# Patient Record
Sex: Male | Born: 1985 | Race: Black or African American | Hispanic: No | Marital: Single | Smoking: Current every day smoker
Health system: Southern US, Community
[De-identification: ages and names within clinical notes are randomized; demographics above are authoritative.]

## PROBLEM LIST (undated history)

## (undated) DIAGNOSIS — L739 Follicular disorder, unspecified: Secondary | ICD-10-CM

---

## 2007-09-03 ENCOUNTER — Emergency Department (HOSPITAL_COMMUNITY): Admission: EM | Admit: 2007-09-03 | Discharge: 2007-09-03 | Payer: Self-pay | Admitting: Emergency Medicine

## 2008-11-20 ENCOUNTER — Emergency Department (HOSPITAL_COMMUNITY): Admission: EM | Admit: 2008-11-20 | Discharge: 2008-11-20 | Payer: Self-pay | Admitting: Emergency Medicine

## 2009-12-06 ENCOUNTER — Emergency Department (HOSPITAL_COMMUNITY): Admission: EM | Admit: 2009-12-06 | Discharge: 2009-12-06 | Payer: Self-pay | Admitting: Emergency Medicine

## 2009-12-14 ENCOUNTER — Emergency Department (HOSPITAL_COMMUNITY): Admission: EM | Admit: 2009-12-14 | Discharge: 2009-12-14 | Payer: Self-pay | Admitting: Emergency Medicine

## 2010-10-23 LAB — RAPID STREP SCREEN (MED CTR MEBANE ONLY): Streptococcus, Group A Screen (Direct): NEGATIVE

## 2011-04-08 LAB — GC/CHLAMYDIA PROBE AMP, GENITAL: Chlamydia, DNA Probe: NEGATIVE

## 2011-07-25 ENCOUNTER — Emergency Department (HOSPITAL_COMMUNITY): Payer: No Typology Code available for payment source

## 2011-07-25 ENCOUNTER — Emergency Department (HOSPITAL_COMMUNITY)
Admission: EM | Admit: 2011-07-25 | Discharge: 2011-07-26 | Disposition: A | Discharge status: Home / Self Care | Payer: No Typology Code available for payment source | Attending: Emergency Medicine | Admitting: Emergency Medicine

## 2011-07-25 ENCOUNTER — Encounter (HOSPITAL_COMMUNITY): Payer: Self-pay | Admitting: *Deleted

## 2011-07-25 DIAGNOSIS — F17200 Nicotine dependence, unspecified, uncomplicated: Secondary | ICD-10-CM | POA: Insufficient documentation

## 2011-07-25 DIAGNOSIS — R10817 Generalized abdominal tenderness: Secondary | ICD-10-CM | POA: Insufficient documentation

## 2011-07-25 DIAGNOSIS — R51 Headache: Secondary | ICD-10-CM | POA: Insufficient documentation

## 2011-07-25 DIAGNOSIS — T148XXA Other injury of unspecified body region, initial encounter: Secondary | ICD-10-CM

## 2011-07-25 DIAGNOSIS — M542 Cervicalgia: Secondary | ICD-10-CM | POA: Insufficient documentation

## 2011-07-25 DIAGNOSIS — S161XXA Strain of muscle, fascia and tendon at neck level, initial encounter: Secondary | ICD-10-CM

## 2011-07-25 DIAGNOSIS — S139XXA Sprain of joints and ligaments of unspecified parts of neck, initial encounter: Secondary | ICD-10-CM | POA: Insufficient documentation

## 2011-07-25 DIAGNOSIS — M549 Dorsalgia, unspecified: Secondary | ICD-10-CM | POA: Insufficient documentation

## 2011-07-25 DIAGNOSIS — J029 Acute pharyngitis, unspecified: Secondary | ICD-10-CM | POA: Insufficient documentation

## 2011-07-25 MED ORDER — DIAZEPAM 5 MG/ML IJ SOLN: 5.0000 mg | Freq: Once | INTRAMUSCULAR | Status: DC

## 2011-07-25 MED ORDER — IBUPROFEN 800 MG PO TABS
800.0000 mg | ORAL_TABLET | Freq: Once | ORAL | Status: AC
  Administered 2011-07-26: 800 mg via ORAL
  Filled 2011-07-25: qty 1

## 2011-07-25 MED ORDER — HYDROCODONE-ACETAMINOPHEN 5-325 MG PO TABS
1.0000 | ORAL_TABLET | Freq: Once | ORAL | Status: AC
  Administered 2011-07-26: 1 via ORAL
  Filled 2011-07-25: qty 1
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
## 2011-07-26 LAB — RAPID STREP SCREEN (MED CTR MEBANE ONLY): Streptococcus, Group A Screen (Direct): NEGATIVE

## 2011-07-26 MED ORDER — DIAZEPAM 5 MG PO TABS
5.0000 mg | ORAL_TABLET | Freq: Once | ORAL | Status: AC
  Administered 2011-07-26: 5 mg via ORAL
  Filled 2011-07-26: qty 1

## 2011-07-26 MED ORDER — DIAZEPAM 5 MG PO TABS: 5.0000 mg | ORAL_TABLET | Freq: Three times a day (TID) | ORAL | Status: AC | PRN

## 2011-07-26 MED ORDER — IBUPROFEN 400 MG PO TABS: 400.0000 mg | ORAL_TABLET | Freq: Four times a day (QID) | ORAL | Status: AC | PRN

## 2011-07-26 MED ORDER — HYDROCODONE-ACETAMINOPHEN 5-325 MG PO TABS: 1.0000 | ORAL_TABLET | ORAL | Status: AC | PRN

## 2011-07-26 NOTE — ED Provider Notes (Signed)
Medical screening examination/treatment/procedure(s) were performed by non-physician practitioner and as supervising physician I was immediately available for consultation/collaboration.   Benny Lennert, MD 07/26/11 1451
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
## 2011-07-26 NOTE — ED Notes (Signed)
Pt able to ambulate without difficulty.

## 2012-07-12 ENCOUNTER — Encounter (HOSPITAL_COMMUNITY): Payer: Self-pay | Admitting: Family Medicine

## 2012-07-12 ENCOUNTER — Emergency Department (HOSPITAL_COMMUNITY)
Admission: EM | Admit: 2012-07-12 | Discharge: 2012-07-12 | Disposition: A | Discharge status: Home / Self Care | Payer: Managed Care, Other (non HMO) | Attending: Emergency Medicine | Admitting: Emergency Medicine

## 2012-07-12 ENCOUNTER — Emergency Department (HOSPITAL_COMMUNITY): Payer: Managed Care, Other (non HMO)

## 2012-07-12 DIAGNOSIS — Z23 Encounter for immunization: Secondary | ICD-10-CM | POA: Insufficient documentation

## 2012-07-12 DIAGNOSIS — Y92009 Unspecified place in unspecified non-institutional (private) residence as the place of occurrence of the external cause: Secondary | ICD-10-CM | POA: Insufficient documentation

## 2012-07-12 DIAGNOSIS — W261XXA Contact with sword or dagger, initial encounter: Secondary | ICD-10-CM | POA: Insufficient documentation

## 2012-07-12 DIAGNOSIS — S61209A Unspecified open wound of unspecified finger without damage to nail, initial encounter: Secondary | ICD-10-CM | POA: Insufficient documentation

## 2012-07-12 DIAGNOSIS — J45909 Unspecified asthma, uncomplicated: Secondary | ICD-10-CM | POA: Insufficient documentation

## 2012-07-12 DIAGNOSIS — S61219A Laceration without foreign body of unspecified finger without damage to nail, initial encounter: Secondary | ICD-10-CM

## 2012-07-12 DIAGNOSIS — Y93G1 Activity, food preparation and clean up: Secondary | ICD-10-CM | POA: Insufficient documentation

## 2012-07-12 DIAGNOSIS — W260XXA Contact with knife, initial encounter: Secondary | ICD-10-CM | POA: Insufficient documentation

## 2012-07-12 MED ORDER — LIDOCAINE HCL (PF) 1 % IJ SOLN: 5.0000 mL | Freq: Once | INTRAMUSCULAR | Status: DC

## 2012-07-12 MED ORDER — TETANUS-DIPHTH-ACELL PERTUSSIS 5-2.5-18.5 LF-MCG/0.5 IM SUSP
0.5000 mL | Freq: Once | INTRAMUSCULAR | Status: AC
  Administered 2012-07-12: 0.5 mL via INTRAMUSCULAR
  Filled 2012-07-12: qty 0.5

## 2012-07-12 NOTE — ED Provider Notes (Signed)
History     CSN: [id number]  Arrival date & time 07/12/12  1452   First MD Initiated Contact with Patient 07/12/12 1700      Chief Complaint  Patient presents with  . Laceration    (Consider location/radiation/quality/duration/timing/severity/associated sxs/prior treatment) HPI  Patient presents to the emergency department after accidentally cutting his right thumb. While he was  cooking  at home just  and the knife slipped. He came in with his finger wrapped. Bleeding is  controlled at this time he denies being unable to feel or move his finger. He feels like it may have gone  part of the nail. nad vss  Past Medical History  Diagnosis Date  . Asthma     History reviewed. No pertinent past surgical history.  History reviewed. No pertinent family history.  History  Substance Use Topics  . Smoking status: Current Every Day Smoker  . Smokeless tobacco: Not on file  . Alcohol Use: Yes      Review of Systems  Review of Systems  Gen: no weight loss, fevers, chills, night sweats  Eyes: no discharge or drainage, no occular pain or visual changes  Nose: no epistaxis or rhinorrhea  Mouth: no dental pain, no sore throat  Neck: no neck pain  Lungs:No wheezing, coughing or hemoptysis CV: no chest pain, palpitations, dependent edema or orthopnea  Abd: no abdominal pain, nausea, vomiting  GU: no dysuria or gross hematuria  MSK:  No abnormalities  Neuro: no headache, no focal neurologic deficits  Skin: laceration to right thumb Psyche: negative.   Allergies  Review of patient's allergies indicates no known allergies.  Home Medications  No current outpatient prescriptions on file.  BP 118/77  Pulse 84  Temp 98 F (36.7 C)  Resp 18  SpO2 100%  Physical Exam  Nursing note and vitals reviewed. Constitutional: He appears well-developed and well-nourished. No distress.  HENT:  Head: Normocephalic and atraumatic.  Eyes: Pupils are equal, round, and reactive to  light.  Neck: Normal range of motion. Neck supple.  Cardiovascular: Normal rate and regular rhythm.   Pulmonary/Chest: Effort normal.  Abdominal: Soft.  Musculoskeletal:       Right hand: He exhibits tenderness and laceration. He exhibits normal range of motion, no bony tenderness, normal two-point discrimination, normal capillary refill, no deformity and no swelling. normal sensation noted. Decreased sensation is not present in the ulnar distribution, is not present in the medial distribution and is not present in the radial distribution. Normal strength noted. He exhibits no finger abduction, no thumb/finger opposition and no wrist extension trouble.       Hands: Neurological: He is alert.  Skin: Skin is warm and dry.    ED Course  LACERATION REPAIR Date/Time: 07/12/2012 5:39 PM Performed by: Dorthula Matas Authorized by: Dorthula Matas Consent: Verbal consent obtained. Risks and benefits: risks, benefits and alternatives were discussed Consent given by: patient Location: right thumb. Laceration length: 1 cm Foreign bodies: metal Tendon involvement: none Nerve involvement: none Vascular damage: no Patient sedated: no Irrigation solution: saline Approximation: close Approximation difficulty: simple Dressing: 4x4 sterile gauze Patient tolerance: Patient tolerated the procedure well with no immediate complications. Comments: Dermabond used to close finger per pt request   (including critical care time)  Labs Reviewed - No data to display No results found.   1. Finger laceration       MDM  Tetanus UTD. Finger Dermabond Pt has been advised of the symptoms that warrant their return  to the ED. Patient has voiced understanding and has agreed to follow-up with the PCP or specialist.       Dorthula Matas, PA 07/12/12 1820

## 2012-07-12 NOTE — ED Notes (Signed)
Per pt sts he was cooking and cut left thumb. Finger wrapped.

## 2012-07-18 NOTE — ED Provider Notes (Signed)
Medical screening examination/treatment/procedure(s) were performed by non-physician practitioner and as supervising physician I was immediately available for consultation/collaboration.  Rui Wordell R. Cody Oliger, MD 07/18/12 0730 

## 2012-12-06 DIAGNOSIS — R51 Headache: Secondary | ICD-10-CM | POA: Insufficient documentation

## 2012-12-06 DIAGNOSIS — F17200 Nicotine dependence, unspecified, uncomplicated: Secondary | ICD-10-CM | POA: Insufficient documentation

## 2012-12-06 DIAGNOSIS — J45909 Unspecified asthma, uncomplicated: Secondary | ICD-10-CM | POA: Insufficient documentation

## 2012-12-06 LAB — CBC
Hemoglobin: 14.1 g/dL (ref 13.0–17.0)
Platelets: 248 10*3/uL (ref 150–400)
RBC: 4.96 MIL/uL (ref 4.22–5.81)
WBC: 5.8 10*3/uL (ref 4.0–10.5)

## 2012-12-06 NOTE — ED Notes (Signed)
Pt c/o posterior HA since yesterday that was decreased with Tylenol. Progressively worsened today.

## 2012-12-07 ENCOUNTER — Emergency Department (HOSPITAL_COMMUNITY)
Admission: EM | Admit: 2012-12-07 | Discharge: 2012-12-07 | Disposition: A | Discharge status: Home / Self Care | Payer: Managed Care, Other (non HMO) | Attending: Emergency Medicine | Admitting: Emergency Medicine

## 2012-12-07 DIAGNOSIS — R51 Headache: Secondary | ICD-10-CM

## 2012-12-07 LAB — COMPREHENSIVE METABOLIC PANEL
BUN: 12 mg/dL (ref 6–23)
CO2: 29 mEq/L (ref 19–32)
Calcium: 9.5 mg/dL (ref 8.4–10.5)
Chloride: 104 mEq/L (ref 96–112)
Creatinine, Ser: 1.28 mg/dL (ref 0.50–1.35)
GFR calc non Af Amer: 76 mL/min — ABNORMAL LOW (ref >90)
Total Bilirubin: 0.7 mg/dL (ref 0.3–1.2)

## 2012-12-07 MED ORDER — METOCLOPRAMIDE HCL 5 MG/ML IJ SOLN
10.0000 mg | Freq: Once | INTRAMUSCULAR | Status: AC
  Administered 2012-12-07: 10 mg via INTRAVENOUS
  Filled 2012-12-07: qty 2

## 2012-12-07 MED ORDER — SODIUM CHLORIDE 0.9 % IV BOLUS (SEPSIS)
1000.0000 mL | Freq: Once | INTRAVENOUS | Status: AC
  Administered 2012-12-07: 1000 mL via INTRAVENOUS

## 2012-12-07 MED ORDER — KETOROLAC TROMETHAMINE 30 MG/ML IJ SOLN
30.0000 mg | Freq: Once | INTRAMUSCULAR | Status: AC
  Administered 2012-12-07: 30 mg via INTRAVENOUS
  Filled 2012-12-07: qty 1

## 2012-12-07 MED ORDER — DIPHENHYDRAMINE HCL 50 MG/ML IJ SOLN
25.0000 mg | Freq: Once | INTRAMUSCULAR | Status: AC
  Administered 2012-12-07: 25 mg via INTRAVENOUS
  Filled 2012-12-07: qty 1

## 2012-12-07 NOTE — ED Notes (Signed)
PT comfortable with d/c and f/u instructions. No prescriptions. 

## 2012-12-07 NOTE — ED Notes (Signed)
NURSE FIRST ROUNDS : NURSE EXPLAINED PROCESS/DELAY AND WAIT TIME , RESPIRATIONS UNLABORED , AMBULATORY.
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
## 2013-05-29 ENCOUNTER — Encounter (HOSPITAL_COMMUNITY): Payer: Self-pay | Admitting: Emergency Medicine

## 2013-05-29 ENCOUNTER — Emergency Department (HOSPITAL_COMMUNITY): Payer: No Typology Code available for payment source

## 2013-05-29 ENCOUNTER — Emergency Department (HOSPITAL_COMMUNITY)
Admission: EM | Admit: 2013-05-29 | Discharge: 2013-05-30 | Disposition: A | Discharge status: Home / Self Care | Payer: No Typology Code available for payment source | Attending: Emergency Medicine | Admitting: Emergency Medicine

## 2013-05-29 DIAGNOSIS — R61 Generalized hyperhidrosis: Secondary | ICD-10-CM | POA: Insufficient documentation

## 2013-05-29 DIAGNOSIS — E876 Hypokalemia: Secondary | ICD-10-CM | POA: Insufficient documentation

## 2013-05-29 DIAGNOSIS — J45909 Unspecified asthma, uncomplicated: Secondary | ICD-10-CM | POA: Insufficient documentation

## 2013-05-29 DIAGNOSIS — R112 Nausea with vomiting, unspecified: Secondary | ICD-10-CM | POA: Insufficient documentation

## 2013-05-29 DIAGNOSIS — R197 Diarrhea, unspecified: Secondary | ICD-10-CM | POA: Insufficient documentation

## 2013-05-29 DIAGNOSIS — K2970 Gastritis, unspecified, without bleeding: Secondary | ICD-10-CM | POA: Insufficient documentation

## 2013-05-29 DIAGNOSIS — R011 Cardiac murmur, unspecified: Secondary | ICD-10-CM | POA: Insufficient documentation

## 2013-05-29 DIAGNOSIS — F17200 Nicotine dependence, unspecified, uncomplicated: Secondary | ICD-10-CM | POA: Insufficient documentation

## 2013-05-29 LAB — CBC WITH DIFFERENTIAL/PLATELET
Basophils Absolute: 0 10*3/uL (ref 0.0–0.1)
Eosinophils Relative: 0 % (ref 0–5)
HCT: 45.1 % (ref 39.0–52.0)
Hemoglobin: 14.9 g/dL (ref 13.0–17.0)
Lymphocytes Relative: 31 % (ref 12–46)
Lymphs Abs: 2.7 10*3/uL (ref 0.7–4.0)
MCV: 87.4 fL (ref 78.0–100.0)
Monocytes Absolute: 0.4 10*3/uL (ref 0.1–1.0)
Monocytes Relative: 4 % (ref 3–12)
Neutro Abs: 5.6 10*3/uL (ref 1.7–7.7)
RBC: 5.16 MIL/uL (ref 4.22–5.81)
WBC: 8.6 10*3/uL (ref 4.0–10.5)

## 2013-05-29 LAB — COMPREHENSIVE METABOLIC PANEL
AST: 17 U/L (ref 0–37)
CO2: 26 mEq/L (ref 19–32)
Chloride: 103 mEq/L (ref 96–112)
Creatinine, Ser: 0.99 mg/dL (ref 0.50–1.35)
GFR calc Af Amer: >90 mL/min (ref >90)
GFR calc non Af Amer: >90 mL/min (ref >90)
Glucose, Bld: 108 mg/dL — ABNORMAL HIGH (ref 70–99)
Total Bilirubin: 1.6 mg/dL — ABNORMAL HIGH (ref 0.3–1.2)

## 2013-05-29 LAB — URINALYSIS, ROUTINE W REFLEX MICROSCOPIC
Glucose, UA: NEGATIVE mg/dL
Hgb urine dipstick: NEGATIVE
Ketones, ur: 15 mg/dL — AB
Leukocytes, UA: NEGATIVE
Protein, ur: NEGATIVE mg/dL
Urobilinogen, UA: 1 mg/dL (ref 0.0–1.0)

## 2013-05-29 MED ORDER — GI COCKTAIL ~~LOC~~
30.0000 mL | Freq: Once | ORAL | Status: AC
  Administered 2013-05-30: 30 mL via ORAL
  Filled 2013-05-29: qty 30

## 2013-05-29 MED ORDER — HYDROMORPHONE HCL PF 1 MG/ML IJ SOLN
0.5000 mg | Freq: Once | INTRAMUSCULAR | Status: AC
  Administered 2013-05-29: 0.5 mg via INTRAVENOUS
  Filled 2013-05-29: qty 1

## 2013-05-29 MED ORDER — SODIUM CHLORIDE 0.9 % IV BOLUS (SEPSIS)
1000.0000 mL | Freq: Once | INTRAVENOUS | Status: AC
  Administered 2013-05-29: 1000 mL via INTRAVENOUS

## 2013-05-29 MED ORDER — ONDANSETRON HCL 4 MG/2ML IJ SOLN
4.0000 mg | Freq: Once | INTRAMUSCULAR | Status: AC
  Administered 2013-05-29: 4 mg via INTRAVENOUS
  Filled 2013-05-29: qty 2

## 2013-05-29 NOTE — ED Notes (Signed)
First contact with pt. Pt resting at this time. Nothing needed. Report received from Melissa, V. 

## 2013-05-29 NOTE — ED Notes (Signed)
The pt woke up this am with abd pain  With n v and diarrhea

## 2013-05-29 NOTE — ED Provider Notes (Signed)
Medical screening examination/treatment/procedure(s) were conducted as a shared visit with non-physician practitioner(s) and myself.  I personally evaluated the patient during the encounter.  EKG Interpretation   None       27 yo male with epigastric abdominal pain starting today.  Associated vomiting.  Well appearing, but tender in epigastrium.  Also mild tenderness in RUQ.  No R/R/G.  RUQ [place], GI cocktail, labs.  Likely gastritis, will plan outpatient PPI if workup negative.    Clinical Impression: 1. Gastritis   2. Hypokalemia       Candyce Churn, MD 05/30/13 1040

## 2013-05-29 NOTE — ED Provider Notes (Signed)
CSN: [id number]     Arrival date & time 05/29/13  2001 History   First MD Initiated Contact with Patient 05/29/13 2148     Chief Complaint  Patient presents with  . Abdominal Pain   (Consider location/radiation/quality/duration/timing/severity/associated sxs/prior Treatment) Patient is a 27 y.o. male presenting with abdominal pain. The history is provided by the patient. No language interpreter was used.  Abdominal Pain Pain location:  Epigastric and R flank Pain quality: cramping   Pain radiates to:  Does not radiate Pain severity:  Moderate Onset quality:  Gradual Duration:  1 day Timing:  Constant Progression:  Worsening Chronicity:  New Context: not alcohol use, not awakening from sleep, not diet changes, not laxative use, not medication withdrawal, not previous surgeries, not recent illness, not recent travel, not sick contacts, not suspicious food intake and not trauma   Relieved by:  None tried Worsened by:  Nothing tried Ineffective treatments:  None tried Associated symptoms: diarrhea, nausea and vomiting   Associated symptoms: no chills, no constipation, no cough, no dysuria, no fatigue, no fever, no hematemesis, no hematochezia, no hematuria, no melena and no sore throat   Diarrhea:    Quality:  Semi-solid   Number of occurrences:  2   Severity:  Mild   Duration:  1 day Nausea:    Severity:  Moderate   Onset quality:  Gradual   Duration:  1 day   Timing:  Constant Vomiting:    Quality:  Bilious material and stomach contents   Number of occurrences:  2   Severity:  Moderate   Duration:  1 day   Timing:  Intermittent Risk factors: no alcohol abuse, no aspirin use, not elderly, has not had multiple surgeries, no NSAID use, not obese and no recent hospitalization     Past Medical History  Diagnosis Date  . Asthma    History reviewed. No pertinent past surgical history. No family history on file. History  Substance Use Topics  . Smoking status: Current Every  Day Smoker  . Smokeless tobacco: Not on file  . Alcohol Use: Yes    Review of Systems  Constitutional: Negative for fever, chills and fatigue.  HENT: Negative for sore throat.   Respiratory: Negative for cough.   Gastrointestinal: Positive for nausea, vomiting, abdominal pain and diarrhea. Negative for constipation, melena, hematochezia and hematemesis.  Genitourinary: Negative for dysuria and hematuria.    Allergies  Review of patient's allergies indicates no known allergies.  Home Medications  No current outpatient prescriptions on file. BP 134/82  Pulse 77  Temp(Src) 97.6 F (36.4 C) (Oral)  Resp 18  Wt 162 lb 8 oz (73.71 kg)  SpO2 99% Physical Exam  Nursing note and vitals reviewed. Constitutional: He is oriented to person, place, and time. He appears well-developed and well-nourished.  HENT:  Head: Normocephalic and atraumatic.  Mouth/Throat: No oropharyngeal exudate.  Eyes: No scleral icterus.  Neck: Neck supple.  Cardiovascular: Normal rate and regular rhythm.   Murmur heard. Pulmonary/Chest: Effort normal and breath sounds normal. He has no wheezes. He has no rales.  Abdominal: Soft. Normal appearance and bowel sounds are normal. He exhibits no distension. There is tenderness in the right upper quadrant and epigastric area. There is CVA tenderness. There is no rigidity, no rebound, no guarding, no tenderness at McBurney's point and negative Murphy's sign.  Musculoskeletal: He exhibits no edema.  Neurological: He is alert and oriented to person, place, and time.  Skin: Skin is warm. No rash noted.  He is diaphoretic.  Psychiatric: He has a normal mood and affect.    ED Course  Procedures (including critical care time) Labs Review Labs Reviewed  COMPREHENSIVE METABOLIC PANEL - Abnormal; Notable for the following:    Potassium 3.2 (*)    Glucose, Bld 108 (*)    Total Bilirubin 1.6 (*)    All other components within normal limits  URINALYSIS, ROUTINE W REFLEX  MICROSCOPIC - Abnormal; Notable for the following:    Specific Gravity, Urine 1.031 (*)    Ketones, ur 15 (*)    All other components within normal limits  CBC WITH DIFFERENTIAL  LIPASE, BLOOD   Imaging Review No results found.  EKG Interpretation   None       MDM   1. Gastritis   2. Hypokalemia    Patient with epigastric pain for one day. Will evaluate Gallbladder and Possible kidney stone.  Labs and imaging ordered. Fluids, Nausea and pain meds given patient reports symptoms are improving.    [place] without hydro or Gallstones or wall thickening.  Most likely gastritis. Patient reports pain and nausea without emesis, medication given. Hypokalemia-will replete. Discussed patient history, condition, with Dr. Loretha Stapler he reports the patient is stable for discharge.  Meds given in ED:  Medications  sodium chloride 0.9 % bolus 1,000 mL (0 mLs Intravenous Stopped 05/29/13 2327)  HYDROmorphone (DILAUDID) injection 0.5 mg (0.5 mg Intravenous Given 05/29/13 2244)  ondansetron (ZOFRAN) injection 4 mg (4 mg Intravenous Given 05/29/13 2244)  gi cocktail (Maalox,Lidocaine,Donnatal) (30 mLs Oral Given 05/30/13 0010)  ondansetron (ZOFRAN) injection 4 mg (4 mg Intravenous Given 05/30/13 0052)  ketorolac (TORADOL) 30 MG/ML injection 30 mg (30 mg Intravenous Given 05/30/13 0052)  HYDROcodone-acetaminophen (HYCET) 7.5-325 mg/15 ml solution 10 mL (10 mLs Oral Given 05/30/13 0138)    Discharge Medication List as of 05/30/2013  1:29 AM    START taking these medications   Details  omeprazole (PRILOSEC) 20 MG capsule Take 1 capsule (20 mg total) by mouth daily., Starting 05/30/2013, Until Discontinued, Print    ondansetron (ZOFRAN) 4 MG tablet Take 1 tablet (4 mg total) by mouth every 6 (six) hours., Starting 05/30/2013, Until Discontinued, Print             Clabe Seal, PA-C 06/01/13 [phone]

## 2013-05-30 MED ORDER — ONDANSETRON HCL 4 MG/2ML IJ SOLN
4.0000 mg | Freq: Once | INTRAMUSCULAR | Status: AC
  Administered 2013-05-30: 4 mg via INTRAVENOUS
  Filled 2013-05-30: qty 2

## 2013-05-30 MED ORDER — POTASSIUM CHLORIDE 20 MEQ/15ML (10%) PO LIQD
40.0000 meq | Freq: Every day | ORAL | Status: DC
  Administered 2013-05-30: 40 meq via ORAL
  Filled 2013-05-30: qty 30

## 2013-05-30 MED ORDER — KETOROLAC TROMETHAMINE 30 MG/ML IJ SOLN
30.0000 mg | Freq: Once | INTRAMUSCULAR | Status: AC
  Administered 2013-05-30: 30 mg via INTRAVENOUS
  Filled 2013-05-30: qty 1

## 2013-05-30 MED ORDER — OMEPRAZOLE 20 MG PO CPDR: 20.0000 mg | DELAYED_RELEASE_CAPSULE | Freq: Every day | ORAL | Status: DC

## 2013-05-30 MED ORDER — HYDROCODONE-ACETAMINOPHEN 7.5-325 MG/15ML PO SOLN
10.0000 mL | Freq: Once | ORAL | Status: AC
  Administered 2013-05-30: 10 mL via ORAL
  Filled 2013-05-30: qty 15

## 2013-05-30 MED ORDER — ONDANSETRON HCL 4 MG PO TABS: 4.0000 mg | ORAL_TABLET | Freq: Four times a day (QID) | ORAL | Status: DC

## 2013-05-30 NOTE — ED Notes (Signed)
Pt transported to u/s.  

## 2013-05-30 NOTE — ED Notes (Signed)
Pt dc to home. Pt sts understanding to dc instructions. Pt ambulatory to exit without difficulty.  Pt denies need for w/c.  

## 2013-05-30 NOTE — ED Notes (Signed)
Pt returned from u/s

## 2013-06-03 NOTE — ED Provider Notes (Signed)
Medical screening examination/treatment/procedure(s) were conducted as a shared visit with non-physician practitioner(s) and myself.  I personally evaluated the patient during the encounter.  EKG Interpretation   None         Candyce Churn, MD 06/03/13 1149

## 2013-11-25 ENCOUNTER — Encounter (HOSPITAL_BASED_OUTPATIENT_CLINIC_OR_DEPARTMENT_OTHER): Payer: Self-pay | Admitting: Emergency Medicine

## 2013-11-25 ENCOUNTER — Emergency Department (HOSPITAL_BASED_OUTPATIENT_CLINIC_OR_DEPARTMENT_OTHER)
Admission: EM | Admit: 2013-11-25 | Discharge: 2013-11-25 | Disposition: A | Discharge status: Home / Self Care | Payer: No Typology Code available for payment source | Attending: Emergency Medicine | Admitting: Emergency Medicine

## 2013-11-25 DIAGNOSIS — IMO0002 Reserved for concepts with insufficient information to code with codable children: Secondary | ICD-10-CM | POA: Insufficient documentation

## 2013-11-25 DIAGNOSIS — S0180XA Unspecified open wound of other part of head, initial encounter: Secondary | ICD-10-CM | POA: Insufficient documentation

## 2013-11-25 DIAGNOSIS — J45909 Unspecified asthma, uncomplicated: Secondary | ICD-10-CM | POA: Insufficient documentation

## 2013-11-25 DIAGNOSIS — Y9289 Other specified places as the place of occurrence of the external cause: Secondary | ICD-10-CM | POA: Insufficient documentation

## 2013-11-25 DIAGNOSIS — Y9389 Activity, other specified: Secondary | ICD-10-CM | POA: Insufficient documentation

## 2013-11-25 DIAGNOSIS — F17200 Nicotine dependence, unspecified, uncomplicated: Secondary | ICD-10-CM | POA: Insufficient documentation

## 2013-11-25 DIAGNOSIS — S0181XA Laceration without foreign body of other part of head, initial encounter: Secondary | ICD-10-CM

## 2013-11-25 DIAGNOSIS — Y990 Civilian activity done for income or pay: Secondary | ICD-10-CM | POA: Insufficient documentation

## 2013-11-25 DIAGNOSIS — Z79899 Other long term (current) drug therapy: Secondary | ICD-10-CM | POA: Insufficient documentation

## 2013-11-25 MED ORDER — IBUPROFEN 800 MG PO TABS: 800.0000 mg | ORAL_TABLET | Freq: Three times a day (TID) | ORAL | Status: DC
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
## 2013-11-25 NOTE — Discharge Instructions (Signed)
Facial Laceration ° A facial laceration is a cut on the face. These injuries can be painful and cause bleeding. Lacerations usually heal quickly, but they need special care to reduce scarring. °DIAGNOSIS  °Your health care provider will take a medical history, ask for details about how the injury occurred, and examine the wound to determine how deep the cut is. °TREATMENT  °Some facial lacerations may not require closure. Others may not be able to be closed because of an increased risk of infection. The risk of infection and the chance for successful closure will depend on various factors, including the amount of time since the injury occurred. °The wound may be cleaned to help prevent infection. If closure is appropriate, pain medicines may be given if needed. Your health care provider will use stitches (sutures), wound glue (adhesive), or skin adhesive strips to repair the laceration. These tools bring the skin edges together to allow for faster healing and a better cosmetic outcome. If needed, you may also be given a tetanus shot. °HOME CARE INSTRUCTIONS °· Only take over-the-counter or prescription medicines as directed by your health care provider. °· Follow your health care provider's instructions for wound care. These instructions will vary depending on the technique used for closing the wound. °For Sutures: °· Keep the wound clean and dry.   °· If you were given a bandage (dressing), you should change it at least once a day. Also change the dressing if it becomes wet or dirty, or as directed by your health care provider.   °· Wash the wound with soap and water 2 times a day. Rinse the wound off with water to remove all soap. Pat the wound dry with a clean towel.   °· After cleaning, apply a thin layer of the antibiotic ointment recommended by your health care provider. This will help prevent infection and keep the dressing from sticking.   °· You may shower as usual after the first 24 hours. Do not soak the  wound in water until the sutures are removed.   °· Get your sutures removed as directed by your health care provider. With facial lacerations, sutures should usually be taken out after 4 5 days to avoid stitch marks.   °· Wait a few days after your sutures are removed before applying any makeup. °For Skin Adhesive Strips: °· Keep the wound clean and dry.   °· Do not get the skin adhesive strips wet. You may bathe carefully, using caution to keep the wound dry.   °· If the wound gets wet, pat it dry with a clean towel.   °· Skin adhesive strips will fall off on their own. You may trim the strips as the wound heals. Do not remove skin adhesive strips that are still stuck to the wound. They will fall off in time.   °For Wound Adhesive: °· You may briefly wet your wound in the shower or bath. Do not soak or scrub the wound. Do not swim. Avoid periods of heavy sweating until the skin adhesive has fallen off on its own. After showering or bathing, gently pat the wound dry with a clean towel.   °· Do not apply liquid medicine, cream medicine, ointment medicine, or makeup to your wound while the skin adhesive is in place. This may loosen the film before your wound is healed.   °· If a dressing is placed over the wound, be careful not to apply tape directly over the skin adhesive. This may cause the adhesive to be pulled off before the wound is healed.   °·   Avoid prolonged exposure to sunlight or tanning lamps while the skin adhesive is in place. °· The skin adhesive will usually remain in place for 5 10 days, then naturally fall off the skin. Do not pick at the adhesive film.   °After Healing: °Once the wound has healed, cover the wound with sunscreen during the day for 1 full year. This can help minimize scarring. Exposure to ultraviolet light in the first year will darken the scar. It can take 1 2 years for the scar to lose its redness and to heal completely.  °SEEK IMMEDIATE MEDICAL CARE IF: °· You have redness, pain, or  swelling around the wound.   °· You see a yellowish-white fluid (pus) coming from the wound.   °· You have chills or a fever.   °MAKE SURE YOU: °· Understand these instructions. °· Will watch your condition. °· Will get help right away if you are not doing well or get worse. °Document Released: 08/08/2004 Document Revised: 04/21/2013 Document Reviewed: 02/11/2013 °Exit[employer]® Patient Information ©2014 [employer]. ° °

## 2013-11-25 NOTE — ED Provider Notes (Signed)
Medical screening examination/treatment/procedure(s) were performed by non-physician practitioner and as supervising physician I was immediately available for consultation/collaboration.   EKG Interpretation None        Charles B. Sheldon, MD 11/25/13 2048 

## 2013-11-25 NOTE — ED Provider Notes (Signed)
CSN: [id number]     Arrival date & time 11/25/13  1408 History   First MD Initiated Contact with Patient 11/25/13 1416     Chief Complaint  Patient presents with  . Head Injury     (Consider location/radiation/quality/duration/timing/severity/associated sxs/prior Treatment) Patient is a 28 y.o. male presenting with head injury. The history is provided by the patient. No language interpreter was used.  Head Injury Location:  Frontal Mechanism of injury: direct blow   Associated symptoms: no headaches, no nausea, no neck pain and no vomiting   Associated symptoms comment:  Forehead laceration caused when a metal tool sprung back hitting him in central forehead. No LOC, headache, neck pain, or nausea.    Past Medical History  Diagnosis Date  . Asthma    History reviewed. No pertinent past surgical history. No family history on file. History  Substance Use Topics  . Smoking status: Current Every Day Smoker    Types: Cigarettes  . Smokeless tobacco: Not on file  . Alcohol Use: Yes    Review of Systems  Eyes: Negative for visual disturbance.  Gastrointestinal: Negative for nausea and vomiting.  Musculoskeletal: Negative for neck pain.  Skin: Positive for wound.  Neurological: Negative for headaches.      Allergies  Review of patient's allergies indicates no known allergies.  Home Medications   Prior to Admission medications   Medication Sig Start Date End Date Taking? Authorizing Provider  omeprazole (PRILOSEC) 20 MG capsule Take 1 capsule (20 mg total) by mouth daily. 05/30/13   Lauren Doretha ImusM Parker, PA-C  ondansetron (ZOFRAN) 4 MG tablet Take 1 tablet (4 mg total) by mouth every 6 (six) hours. 05/30/13   Lauren Doretha ImusM Parker, PA-C   BP 144/83  Pulse 81  Temp(Src) 98.8 F (37.1 C) (Oral)  Resp 20  Ht 5\' 7"  (1.702 m)  Wt 160 lb (72.576 kg)  BMI 25.05 kg/m2  SpO2 100% Physical Exam  Constitutional: He is oriented to person, place, and time. He appears well-developed and  well-nourished. No distress.  HENT:  Head: Normocephalic.  .   Eyes: Conjunctivae are normal. Pupils are equal, round, and reactive to light.  Neck: Normal range of motion.  Pulmonary/Chest: Effort normal.  Abdominal: There is no tenderness.  Musculoskeletal:  No midline cervical or paracervical tenderness.   Neurological: He is alert and oriented to person, place, and time. Coordination normal.  Skin: Skin is warm and dry.  4 cm full thickness laceration to central forehead with associated hematoma.  Psychiatric: He has a normal mood and affect.    ED Course  Procedures (including critical care time) Labs Review Labs Reviewed - No data to display  Imaging Review No results found.   EKG Interpretation None     LACERATION REPAIR Performed by: Arnoldo HookerShari A Bailee Metter Authorized by: Arnoldo HookerShari A Deanna Boehlke Consent: Verbal consent obtained. Risks and benefits: risks, benefits and alternatives were discussed Consent given by: patient Patient identity confirmed: provided demographic data Prepped and Draped in normal sterile fashion Wound explored  Laceration Location: forehead  Laceration Length: 4cm  No Foreign Bodies seen or palpated  Anesthesia: local infiltration  Local anesthetic: lidocaine 2% w/o epinephrine  Anesthetic total: 2 ml  Irrigation method: syringe Amount of cleaning: standard  Skin closure: 6-0 Vicryl and 6-0 Prolone  Number of sutures: 2 SQ sutures, 7 dermal sutures  Technique: simple interrupted  Patient tolerance: Patient tolerated the procedure well with no immediate complications.  MDM   Final diagnoses:  None  1. Facial laceration  No evidence to suggest intracranial head injury - no neurologic deficits on exam. Laceration repaired, care instructions given.     Arnoldo HookerShari A Kailash Hinze, PA-C 11/25/13 1556

## 2013-11-25 NOTE — ED Notes (Signed)
Suture cart is at the bedside set up and ready for the doctor to use. 

## 2013-12-02 NOTE — ED Notes (Signed)
Mother of patient called and demanded to have our facility fill out FMLA paper work on her for being in the ed with her son.  Verified with our director, that we can not fill out paperwork or generate a letter for stating that we do not fill out the paperwork.  Mrs. Meyers hung on on this nurse.

## 2014-04-23 ENCOUNTER — Encounter (HOSPITAL_COMMUNITY): Payer: Self-pay | Admitting: Emergency Medicine

## 2014-04-23 ENCOUNTER — Emergency Department (HOSPITAL_COMMUNITY)
Admission: EM | Admit: 2014-04-23 | Discharge: 2014-04-24 | Disposition: A | Discharge status: Home / Self Care | Payer: No Typology Code available for payment source | Attending: Emergency Medicine | Admitting: Emergency Medicine

## 2014-04-23 DIAGNOSIS — F1010 Alcohol abuse, uncomplicated: Secondary | ICD-10-CM | POA: Diagnosis not present

## 2014-04-23 DIAGNOSIS — F1410 Cocaine abuse, uncomplicated: Secondary | ICD-10-CM | POA: Diagnosis not present

## 2014-04-23 DIAGNOSIS — R45851 Suicidal ideations: Secondary | ICD-10-CM | POA: Diagnosis present

## 2014-04-23 DIAGNOSIS — F1910 Other psychoactive substance abuse, uncomplicated: Secondary | ICD-10-CM

## 2014-04-23 DIAGNOSIS — Z720 Tobacco use: Secondary | ICD-10-CM | POA: Diagnosis not present

## 2014-04-23 DIAGNOSIS — F12129 Cannabis abuse with intoxication, unspecified: Secondary | ICD-10-CM | POA: Insufficient documentation

## 2014-04-23 DIAGNOSIS — F1999 Other psychoactive substance use, unspecified with unspecified psychoactive substance-induced disorder: Secondary | ICD-10-CM | POA: Insufficient documentation

## 2014-04-23 DIAGNOSIS — J45909 Unspecified asthma, uncomplicated: Secondary | ICD-10-CM | POA: Insufficient documentation

## 2014-04-23 DIAGNOSIS — F14129 Cocaine abuse with intoxication, unspecified: Secondary | ICD-10-CM | POA: Insufficient documentation

## 2014-04-23 DIAGNOSIS — F1994 Other psychoactive substance use, unspecified with psychoactive substance-induced mood disorder: Secondary | ICD-10-CM | POA: Diagnosis not present

## 2014-04-23 LAB — COMPREHENSIVE METABOLIC PANEL
ALBUMIN: 3.8 g/dL (ref 3.5–5.2)
ALT: 13 U/L (ref 0–53)
ANION GAP: 13 (ref 5–15)
AST: 22 U/L (ref 0–37)
Alkaline Phosphatase: 65 U/L (ref 39–117)
BUN: 7 mg/dL (ref 6–23)
CO2: 27 mEq/L (ref 19–32)
CREATININE: 1.09 mg/dL (ref 0.50–1.35)
Calcium: 9.1 mg/dL (ref 8.4–10.5)
Chloride: 103 mEq/L (ref 96–112)
GFR calc Af Amer: >90 mL/min (ref >90)
GFR calc non Af Amer: >90 mL/min (ref >90)
Glucose, Bld: 93 mg/dL (ref 70–99)
POTASSIUM: 3.5 meq/L — AB (ref 3.7–5.3)
Sodium: 143 mEq/L (ref 137–147)
TOTAL PROTEIN: 6.4 g/dL (ref 6.0–8.3)
Total Bilirubin: 1.1 mg/dL (ref 0.3–1.2)

## 2014-04-23 LAB — CBC WITH DIFFERENTIAL/PLATELET
BASOS PCT: 0 % (ref 0–1)
Basophils Absolute: 0 10*3/uL (ref 0.0–0.1)
EOS ABS: 0 10*3/uL (ref 0.0–0.7)
Eosinophils Relative: 1 % (ref 0–5)
HCT: 40.1 % (ref 39.0–52.0)
Hemoglobin: 13 g/dL (ref 13.0–17.0)
Lymphocytes Relative: 31 % (ref 12–46)
Lymphs Abs: 1.8 10*3/uL (ref 0.7–4.0)
MCH: 28.4 pg (ref 26.0–34.0)
MCHC: 32.4 g/dL (ref 30.0–36.0)
MCV: 87.6 fL (ref 78.0–100.0)
MONO ABS: 0.4 10*3/uL (ref 0.1–1.0)
Monocytes Relative: 7 % (ref 3–12)
NEUTROS PCT: 61 % (ref 43–77)
Neutro Abs: 3.7 10*3/uL (ref 1.7–7.7)
Platelets: 213 10*3/uL (ref 150–400)
RBC: 4.58 MIL/uL (ref 4.22–5.81)
RDW: 12.7 % (ref 11.5–15.5)
WBC: 6 10*3/uL (ref 4.0–10.5)

## 2014-04-23 LAB — RAPID URINE DRUG SCREEN, HOSP PERFORMED
Amphetamines: NOT DETECTED
BENZODIAZEPINES: NOT DETECTED
Barbiturates: NOT DETECTED
COCAINE: POSITIVE — AB
Opiates: NOT DETECTED
TETRAHYDROCANNABINOL: POSITIVE — AB

## 2014-04-23 LAB — ETHANOL: Alcohol, Ethyl (B): 14 mg/dL — ABNORMAL HIGH (ref 0–11)

## 2014-04-23 MED ORDER — ONDANSETRON HCL 4 MG PO TABS: 4.0000 mg | ORAL_TABLET | Freq: Three times a day (TID) | ORAL | Status: DC | PRN

## 2014-04-23 MED ORDER — ALUM & MAG HYDROXIDE-SIMETH 200-200-20 MG/5ML PO SUSP: 30.0000 mL | ORAL | Status: DC | PRN

## 2014-04-23 MED ORDER — LORAZEPAM 1 MG PO TABS: 1.0000 mg | ORAL_TABLET | Freq: Three times a day (TID) | ORAL | Status: DC | PRN

## 2014-04-23 NOTE — [facility] Counselor (Signed)
Counselor ran the Pt by AutoZone[employer]Josephine Onouha. NP Onouha states the Pt meets inpatient criteria. [facility]. Placement will be sought for Pt.  Wolfgang PhoenixBrandi Kiyan Burmester, [facility] Triage Specialist

## 2014-04-23 NOTE — [facility] Counselor (Signed)
PT has been assessed.   Wolfgang PhoenixBrandi Maize Brittingham, [facility] Triage Specialist

## 2014-04-23 NOTE — BH Assessment (Signed)
Tele Assessment Note   Curtis Miranda is an 28 y.o. male. The Pt reports feeling depressed for the past three weeks. Pt stated that he is SI but does not have a plan. According to the Pt, he has been thinking about harming himself since he began feeling depressed. The Pt states he is depressed due to having issues with his child's mother and issues at his job. The Pt described that following depressive symptoms: fatigue, worthlessness, difficulty sleeping, decreased appetite, feeling depressed most of the day, and constantly crying. Pt denies HI. Pt reports no previous SI/HI attempts. The Pt reports he was seeing a therapist Dr. Yvone NeuPatton but he had to stop because he could not afford to continue to attend his sessions. The Pt states he would like to receive mental health services but he cannot afford services at this. Pt reports he is not certain he could go home and feel safe.  The Clinical research associatewriter ran the Pt by NP Dahlia ByesJosephine Onuoha. NP Onuoha states Pt meets inpatient criteria. [facility] does not have available beds. Currently seeking placement for the Pt.  Axis I: Depressive Disorder NOS Axis II: Deferred Axis III:  Past Medical History  Diagnosis Date  . Asthma    Axis IV: problems related to social environment and problems with primary support group Axis V: 31-40 impairment in reality testing-35  Past Medical History:  Past Medical History  Diagnosis Date  . Asthma     History reviewed. No pertinent past surgical history.  Family History: No family history on file.  Social History:  reports that he has been smoking Cigarettes.  He has been smoking about 0.00 packs per day. He does not have any smokeless tobacco history on file. He reports that he drinks alcohol. He reports that he uses illicit drugs (Marijuana).  Additional Social History:  Alcohol / Drug Use Pain Medications: Pt denies abuse Prescriptions: PT denies abuse Over the Counter: Pt denies abuse History of alcohol / drug use?: Yes Substance  #1 Name of Substance 1: Cocaine 1 - Age of First Use: 22 1 - Amount (size/oz): a gram 1 - Frequency: every other day 1 - Duration: 5 years 1 - Last Use / Amount: 04/22/14 Substance #2 Name of Substance 2: Marijuana 2 - Age of First Use: 18 2 - Amount (size/oz): a blunt 2 - Frequency: every other day 2 - Duration: 10 years 2 - Last Use / Amount: 04/22/14 Substance #3 Name of Substance 3: Alcohol 3 - Age of First Use: 18 3 - Amount (size/oz): 4 or 5 12 oz beers plus liquor 3 - Frequency: everyday 3 - Duration: 10 years 3 - Last Use / Amount: 04/22/14  CIWA: CIWA-Ar BP: 123/85 mmHg Pulse Rate: 89 COWS:    PATIENT STRENGTHS: (choose at least two) Average or above average intelligence Capable of independent living Communication skills  Allergies: No Known Allergies  Home Medications:  (Not in a hospital admission)  OB/GYN Status:  No LMP for male patient.  General Assessment Data [facility] ACT Assessment: Yes Is this a Tele or Face-to-Face Assessment?: Tele Assessment Is this an Initial Assessment or a Re-assessment for this encounter?: Initial Assessment Living Arrangements: Alone Can pt return to current living arrangement?: Yes Admission Status: Voluntary Is patient capable of signing voluntary admission?: Yes Transfer from: Home Referral Source: Self/Family/Friend     [facility] Living Arrangements: Alone Name of Therapist: Dr. Yvone NeuPatton  Education Status Is patient currently in school?: No Current Grade:  NA Highest grade of school patient has completed: 12 Name of school: NA  Risk to self with the past 6 months Suicidal Ideation: Yes-Currently Present Suicidal Intent: No Is patient at risk for suicide?: Yes Suicidal Plan?: No Access to Means: No What has been your use of drugs/alcohol within the last 12 months?: Every other day cocaine, marijuana, and alcohol Previous Attempts/Gestures: No How many times?: 0 Other Self  Harm Risks: NA Triggers for Past Attempts: None known Intentional Self Injurious Behavior: None Family Suicide History: No Recent stressful life event(s): Conflict (Comment);Job Loss (In conflict with child's mother,) Persecutory voices/beliefs?: No Depression: Yes Depression Symptoms: Insomnia;Tearfulness;Fatigue;Feeling worthless/self pity;Feeling angry/irritable Substance abuse history and/or treatment for substance abuse?: No Suicide prevention information given to non-admitted patients: Not applicable  Risk to Others within the past 6 months Homicidal Ideation: No Thoughts of Harm to Others: No Current Homicidal Intent: No Current Homicidal Plan: No Access to Homicidal Means: No Identified Victim: NA History of harm to others?: No Assessment of Violence: None Noted Violent Behavior Description: NA Does patient have access to weapons?: No Criminal Charges Pending?: No Does patient have a court date: No  Psychosis Hallucinations: None noted Delusions: None noted  Mental Status Report Appear/Hygiene: In scrubs;Unremarkable Eye Contact: Fair Motor Activity: Freedom of movement;Unremarkable Speech: Logical/coherent Level of Consciousness: Alert Mood: Depressed Affect: Depressed Anxiety Level: Moderate Thought Processes: Coherent;Relevant Judgement: Unimpaired Orientation: Person;Place;Time;Situation Obsessive Compulsive Thoughts/Behaviors: None  Cognitive Functioning Concentration: Fair Memory: Recent Intact;Remote Intact IQ: Average Insight: Fair Impulse Control: Fair Appetite: Poor Weight Loss: 0 Weight Gain: 0 Sleep: Decreased Total Hours of Sleep: 6 Vegetative Symptoms: None  ADLScreening [facility] Assessment Services) Patient's cognitive ability adequate to safely complete daily activities?: Yes Patient able to express need for assistance with ADLs?: Yes Independently performs ADLs?: Yes (appropriate for developmental age)  Prior Inpatient Therapy Prior  Inpatient Therapy: No Prior Therapy Dates: NA Prior Therapy Facilty/Provider(s): NA Reason for Treatment: NA  Prior Outpatient Therapy Prior Outpatient Therapy: No Prior Therapy Dates: NA Prior Therapy Facilty/Provider(s): NA Reason for Treatment: NA  ADL Screening (condition at time of admission) Patient's cognitive ability adequate to safely complete daily activities?: Yes Is the patient deaf or have difficulty hearing?: No Does the patient have difficulty seeing, even when wearing glasses/contacts?: No Does the patient have difficulty concentrating, remembering, or making decisions?: No Patient able to express need for assistance with ADLs?: Yes Does the patient have difficulty dressing or bathing?: No Independently performs ADLs?: Yes (appropriate for developmental age) Does the patient have difficulty walking or climbing stairs?: No Weakness of Legs: None Weakness of Arms/Hands: None  Home Assistive Devices/Equipment Home Assistive Devices/Equipment: None    Abuse/Neglect Assessment (Assessment to be complete while patient is alone) Physical Abuse: Denies Verbal Abuse: Denies Sexual Abuse: Denies Exploitation of patient/patient's resources: Denies Self-Neglect: Denies Values / Beliefs Cultural Requests During Hospitalization: None Spiritual Requests During Hospitalization: None   Advance Directives (For Healthcare) Does patient have an advance directive?: No Would patient like information on creating an advanced directive?: No - patient declined information    Additional Information 1:1 In Past 12 Months?: No CIRT Risk: No Elopement Risk: No Does patient have medical clearance?: Yes     Disposition:  Disposition Initial Assessment Completed for this Encounter: Yes Disposition of Patient: Inpatient treatment program (Per NP Fayette PhoJosephine Onouha Pt needs inpatient txtment) Type of inpatient treatment program: Adult  Emmit PomfretLevette,Leman Martinek D 04/23/2014 12:35 PM

## 2014-04-23 NOTE — [facility] Counselor (Signed)
[facility]  Wolfgang PhoenixBrandi Dorotea Hand, [facility] Triage Specialist

## 2014-04-23 NOTE — ED Notes (Signed)
Security wanded pt. at triage. 

## 2014-04-23 NOTE — ED Provider Notes (Signed)
CSN: [id number]     Arrival date & time 04/23/14  0556 History   First MD Initiated Contact with Patient 04/23/14 0715     Chief Complaint  Patient presents with  . Suicidal     (Consider location/radiation/quality/duration/timing/severity/associated sxs/prior Treatment) The history is provided by the patient.   patient presents with depression and early suicidal thoughts. Pressures been going for a while but states suicidal thoughts became worse last night. Does not have a plan. States it is due to issues with his child's mother. He states he's always had some depression. He states he will do cocaine about every other day and drinks about a sixpack of beer every other day also. He denies suicidal attempt. No chest pain no trouble breathing. No previous treatment for depression. No hallucinations. No homicidal thoughts.  Past Medical History  Diagnosis Date  . Asthma    History reviewed. No pertinent past surgical history. No family history on file. History  Substance Use Topics  . Smoking status: Current Every Day Smoker    Types: Cigarettes  . Smokeless tobacco: Not on file  . Alcohol Use: Yes    Review of Systems  Constitutional: Negative for activity change and appetite change.  Eyes: Negative for pain.  Respiratory: Negative for chest tightness and shortness of breath.   Cardiovascular: Negative for chest pain and leg swelling.  Gastrointestinal: Negative for nausea, vomiting, abdominal pain and diarrhea.  Genitourinary: Negative for flank pain.  Musculoskeletal: Negative for back pain and neck stiffness.  Skin: Negative for rash.  Neurological: Negative for weakness, numbness and headaches.  Psychiatric/Behavioral: Positive for suicidal ideas and dysphoric mood. Negative for hallucinations and behavioral problems. The patient is not nervous/anxious.       Allergies  Review of patient's allergies indicates no known allergies.  Home Medications   Prior to Admission  medications   Not on File   BP 123/85  Pulse 89  Temp(Src) 98.2 F (36.8 C) (Oral)  Resp 20  Wt 150 lb (68.04 kg)  SpO2 100% Physical Exam  Nursing note and vitals reviewed. Constitutional: He is oriented to person, place, and time. He appears well-developed and well-nourished.  HENT:  Head: Normocephalic and atraumatic.  Eyes: Pupils are equal, round, and reactive to light.  Neck: Normal range of motion. Neck supple.  Cardiovascular: Normal rate, regular rhythm and normal heart sounds.   No murmur heard. Pulmonary/Chest: Effort normal and breath sounds normal.  Abdominal: Soft. Bowel sounds are normal. He exhibits no distension and no mass. There is no tenderness. There is no rebound and no guarding.  Musculoskeletal: Normal range of motion. He exhibits no edema.  Neurological: He is alert and oriented to person, place, and time. No cranial nerve deficit.  Skin: Skin is warm and dry.  Psychiatric:  Patient appears depressed    ED Course  Procedures (including critical care time) Labs Review Labs Reviewed  ETHANOL - Abnormal; Notable for the following:    Alcohol, Ethyl (B) 14 (*)    All other components within normal limits  URINE RAPID DRUG SCREEN (HOSP PERFORMED) - Abnormal; Notable for the following:    Cocaine POSITIVE (*)    Tetrahydrocannabinol POSITIVE (*)    All other components within normal limits  COMPREHENSIVE METABOLIC PANEL - Abnormal; Notable for the following:    Potassium 3.5 (*)    All other components within normal limits  CBC WITH DIFFERENTIAL    Imaging Review No results found.   EKG Interpretation None  MDM   Final diagnoses:  None    Patient with substance abuse and depression with some suicidal thoughts. No suicidal plans yet. Medically cleared. To be seen by TTS.    Juliet RudeNathan R. Rubin PayorPickering, MD 04/23/14 1136

## 2014-04-23 NOTE — ED Notes (Signed)
Video monitor placed in room so psychiatrist will be able to speak to pt; sitter at bedside

## 2014-04-23 NOTE — Progress Notes (Signed)
[facility] contacted the following facilities about psychiatric placement.  FAX REFERRALS WITH BED AVAILABILITY 1)[facility] 2)[facility] 3)[facility] 4)[facility] 5)[facility] 6)[facility]  AT CAPACITY\ 1)[facility] 2)[facility] 3)[facility] 4)[facility] 5)[facility] 7)[facility] 9)[facility] 10)[facility] 11)[facility] 12)[facility]  Blain PaisMichelle L Ianmichael Amescua, [facility]

## 2014-04-23 NOTE — ED Notes (Signed)
Pt. reports feeling depressed with suicidal ideation , pt. did not disclose plan of suicide , denies visual or auditory hallucinations . Staffing office notified for pt.'s sitter.

## 2014-04-23 NOTE — [facility] Counselor (Signed)
Counselor informed Dr. Rubin PayorPickering and Tula NakayamaSteven-RN that placement is being sought for Pt.  Curtis PhoenixBrandi Shatonia Hoots, [facility] Triage Specialist

## 2014-04-23 NOTE — ED Notes (Signed)
Purple paper scrubs given to pt. To wear , security notified to wand pt. , personal belongings bagged and labelled.

## 2014-04-23 NOTE — [facility] Counselor (Signed)
Counselor spoke with Dr. Rubin PayorPickering and Tula NakayamaSteven-RN about the PT.   Wolfgang PhoenixBrandi Yeilyn Gent, [facility] Triage Specialist

## 2014-04-23 NOTE — ED Notes (Addendum)
Patient appears sad and withdrawn but is calm and cooperative.  States he has no plan, but has thoughts of harming himself, denies history.   Is sad because his wife/girlfriend's has not been showing him or their 27 year old daughter any attention since getting back together.  Patient states that he has been trying to make things work.  Patient denies plan of harming himself but is tearful during assessment and states that he has thoughts of harming himself sometimes.  Denies history of self-harm attempts.  Plan of care discussed with patient, blood specimens drawn.  Prompted for urine, warm blankets delivered, and meal ordered.

## 2014-04-23 NOTE — ED Notes (Signed)
Relieving sitter for break; pt resting; no needs at this time

## 2014-04-24 ENCOUNTER — Observation Stay (HOSPITAL_COMMUNITY)
Admission: AD | Admit: 2014-04-24 | Payer: No Typology Code available for payment source | Source: Intra-hospital | Admitting: Psychiatry

## 2014-04-24 DIAGNOSIS — F1010 Alcohol abuse, uncomplicated: Secondary | ICD-10-CM

## 2014-04-24 DIAGNOSIS — F1410 Cocaine abuse, uncomplicated: Secondary | ICD-10-CM

## 2014-04-24 DIAGNOSIS — F1994 Other psychoactive substance use, unspecified with psychoactive substance-induced mood disorder: Secondary | ICD-10-CM | POA: Diagnosis not present

## 2014-04-24 MED ORDER — POTASSIUM CHLORIDE CRYS ER 20 MEQ PO TBCR
40.0000 meq | EXTENDED_RELEASE_TABLET | Freq: Once | ORAL | Status: AC
  Administered 2014-04-24: 40 meq via ORAL
  Filled 2014-04-24: qty 2

## 2014-04-24 NOTE — ED Notes (Signed)
TELEPSYCH IN PROGRESS 

## 2014-04-24 NOTE — ED Notes (Signed)
[facility]  At 1200 for eval .

## 2014-04-24 NOTE — ED Notes (Signed)
PT reports " I am not wanting to hurt myself any more I just want to go home". Pt instructed HE will need to have a TTS eval before d/C.

## 2014-04-24 NOTE — Consult Note (Signed)
Telepsych Consultation   Reason for Consult:  SI Referring Physician:  EDMD  Curtis Miranda is an 28 y.o. male.  Assessment: AXIS I:  Cocaine abuse chronic, alcohol abuse chronic, SIMDO AXIS II:  Deferred AXIS III:   Past Medical History  Diagnosis Date  . Asthma    AXIS IV:  economic problems, other psychosocial or environmental problems, problems related to social environment, problems with access to health care services and problems with primary support group AXIS V:  61-70 mild symptoms  Plan:  No evidence of imminent risk to self or others at present.    Subjective:   Curtis Miranda is a 28 y.o. male patient presented to the [facility] reporting SI. He stated that he had no plan with no previous attempts. He stated that he has felt depressed for the past few months since he got back together with his [name] in July. He states his symptoms of depression have worsened over the past few weeks because his [name] is not paying enough attention to him. He states his only symptoms of depression currently are occasionally he will burst into tears "for no reason at all" and irritability.      He states when he came to the ED yesterday it was because he was frustrated over the situation with her. He does admit that he drinks 4-6 beers every other day and uses 1 gram of cocaine every other day and has done this since he was 22.  He feels that he needs help for his drug problems which is the reason for his current situation. His [name] is getting counseling for her drug problem through Dr. Haynes Kerns. The patient himself has seen Dr. Haynes Kerns in the past for court ordered drug therapy for his probation due to marijuana charges. He has no charges currently.       The patient is A&O x3, denies SI/HI or AVH and is in full contact with reality. He states he was frustrated with his current situation because he has already been trying to find out patient drug treatment that he can afford, but it is difficult since he  works two jobs.  He also notes that since the [name] is already seeing Dr. Haynes Kerns, he would like to see someone else.  The patient wants to leave today to return to work and requests assistance with out patient treatment. Marland Kitchen  HPI:  See above HPI Elements:   Location:  [facility]. Quality:  chronic. Severity:  mild. Timing:  on going. Duration:  since age 57. Context:  relationship, substance abuse.  Past Psychiatric History: Past Medical History  Diagnosis Date  . Asthma     reports that he has been smoking Cigarettes.  He has been smoking about 0.00 packs per day. He does not have any smokeless tobacco history on file. He reports that he drinks alcohol. He reports that he uses illicit drugs (Marijuana). No family history on file. Family History Substance Abuse: No Family Supports: Yes, List: (Mother) Living Arrangements: Alone Can pt return to current living arrangement?: Yes Allergies:  No Known Allergies  ACT Assessment Complete:  Yes:    Educational Status    Risk to Self: Risk to self with the past 6 months Suicidal Ideation: Yes-Currently Present Suicidal Intent: No Is patient at risk for suicide?: Yes Suicidal Plan?: No Access to Means: No What has been your use of drugs/alcohol within the last 12 months?: Every other day cocaine, marijuana, and alcohol Previous Attempts/Gestures: No How  many times?: 0 Other Self Harm Risks: NA Triggers for Past Attempts: None known Intentional Self Injurious Behavior: None Family Suicide History: No Recent stressful life event(s): Conflict (Comment);Job Loss (In conflict with child's mother,) Persecutory voices/beliefs?: No Depression: Yes Depression Symptoms: Insomnia;Tearfulness;Fatigue;Feeling worthless/self pity;Feeling angry/irritable Substance abuse history and/or treatment for substance abuse?: No Suicide prevention information given to non-admitted patients: Not applicable  Risk to Others: Risk to Others within the past 6  months Homicidal Ideation: No Thoughts of Harm to Others: No Current Homicidal Intent: No Current Homicidal Plan: No Access to Homicidal Means: No Identified Victim: NA History of harm to others?: No Assessment of Violence: None Noted Violent Behavior Description: NA Does patient have access to weapons?: No Criminal Charges Pending?: No Does patient have a court date: No  Abuse: Abuse/Neglect Assessment (Assessment to be complete while patient is alone) Physical Abuse: Denies Verbal Abuse: Denies Sexual Abuse: Denies Exploitation of patient/patient's resources: Denies Self-Neglect: Denies  Prior Inpatient Therapy: Prior Inpatient Therapy Prior Inpatient Therapy: No Prior Therapy Dates: NA Prior Therapy Facilty/Provider(s): NA Reason for Treatment: NA  Prior Outpatient Therapy: Prior Outpatient Therapy Prior Outpatient Therapy: No Prior Therapy Dates: NA Prior Therapy Facilty/Provider(s): NA Reason for Treatment: NA  Additional Information: Additional Information 1:1 In Past 12 Months?: No CIRT Risk: No Elopement Risk: No Does patient have medical clearance?: Yes                  Objective: Blood pressure 94/49, pulse 95, temperature 97.9 F (36.6 C), temperature source Oral, resp. rate 16, weight 68.04 kg (150 lb), SpO2 100.00%.Body mass index is 23.49 kg/(m^2). Results for orders placed during the hospital encounter of 04/23/14 (from the past 72 hour(s))  ETHANOL     Status: Abnormal   Collection Time    04/23/14  6:32 AM      Result Value Ref Range   Alcohol, Ethyl (B) 14 (*) 0 - 11 mg/dL   Comment:            LOWEST DETECTABLE LIMIT FOR     SERUM ALCOHOL IS 11 mg/dL     FOR MEDICAL PURPOSES ONLY  CBC WITH DIFFERENTIAL     Status: None   Collection Time    04/23/14  6:32 AM      Result Value Ref Range   WBC 6.0  4.0 - 10.5 K/uL   RBC 4.58  4.22 - 5.81 MIL/uL   Hemoglobin 13.0  13.0 - 17.0 g/dL   HCT 40.1  39.0 - 52.0 %   MCV 87.6  78.0 - 100.0  fL   MCH 28.4  26.0 - 34.0 pg   MCHC 32.4  30.0 - 36.0 g/dL   RDW 12.7  11.5 - 15.5 %   Platelets 213  150 - 400 K/uL   Neutrophils Relative % 61  43 - 77 %   Neutro Abs 3.7  1.7 - 7.7 K/uL   Lymphocytes Relative 31  12 - 46 %   Lymphs Abs 1.8  0.7 - 4.0 K/uL   Monocytes Relative 7  3 - 12 %   Monocytes Absolute 0.4  0.1 - 1.0 K/uL   Eosinophils Relative 1  0 - 5 %   Eosinophils Absolute 0.0  0.0 - 0.7 K/uL   Basophils Relative 0  0 - 1 %   Basophils Absolute 0.0  0.0 - 0.1 K/uL  COMPREHENSIVE METABOLIC PANEL     Status: Abnormal   Collection Time    04/23/14  6:32  AM      Result Value Ref Range   Sodium 143  137 - 147 mEq/L   Potassium 3.5 (*) 3.7 - 5.3 mEq/L   Chloride 103  96 - 112 mEq/L   CO2 27  19 - 32 mEq/L   Glucose, Bld 93  70 - 99 mg/dL   BUN 7  6 - 23 mg/dL   Creatinine, Ser 1.09  0.50 - 1.35 mg/dL   Calcium 9.1  8.4 - 10.5 mg/dL   Total Protein 6.4  6.0 - 8.3 g/dL   Albumin 3.8  3.5 - 5.2 g/dL   AST 22  0 - 37 U/L   ALT 13  0 - 53 U/L   Alkaline Phosphatase 65  39 - 117 U/L   Total Bilirubin 1.1  0.3 - 1.2 mg/dL   GFR calc non Af Amer >90  >90 mL/min   GFR calc Af Amer >90  >90 mL/min   Comment: (NOTE)     The eGFR has been calculated using the CKD EPI equation.     This calculation has not been validated in all clinical situations.     eGFR's persistently <90 mL/min signify possible Chronic Kidney     Disease.   Anion gap 13  5 - 15  URINE RAPID DRUG SCREEN (HOSP PERFORMED)     Status: Abnormal   Collection Time    04/23/14 10:15 AM      Result Value Ref Range   Opiates NONE DETECTED  NONE DETECTED   Cocaine POSITIVE (*) NONE DETECTED   Benzodiazepines NONE DETECTED  NONE DETECTED   Amphetamines NONE DETECTED  NONE DETECTED   Tetrahydrocannabinol POSITIVE (*) NONE DETECTED   Barbiturates NONE DETECTED  NONE DETECTED   Comment:            DRUG SCREEN FOR MEDICAL PURPOSES     ONLY.  IF CONFIRMATION IS NEEDED     FOR ANY PURPOSE, NOTIFY LAB     WITHIN  5 DAYS.                LOWEST DETECTABLE LIMITS     FOR URINE DRUG SCREEN     Drug Class       Cutoff (ng/mL)     Amphetamine      1000     Barbiturate      200     Benzodiazepine   681     Tricyclics       157     Opiates          300     Cocaine          300     THC              50   Labs are reviewed and are pertinent for BAL 14, + Cocaine and THC.  Current Facility-Administered Medications  Medication Dose Route Frequency Provider Last Rate Last Dose  . alum & mag hydroxide-simeth (MAALOX/MYLANTA) 200-200-20 MG/5ML suspension 30 mL  30 mL Oral PRN Jasper Riling. Pickering, MD      . LORazepam (ATIVAN) tablet 1 mg  1 mg Oral Q8H PRN Jasper Riling. Pickering, MD      . ondansetron [facility]) tablet 4 mg  4 mg Oral Q8H PRN Jasper Riling. Alvino Chapel, MD       No current outpatient prescriptions on file.    Psychiatric Specialty Exam:     Blood pressure 94/49, pulse 95, temperature 97.9 F (36.6 C), temperature  source Oral, resp. rate 16, weight 68.04 kg (150 lb), SpO2 100.00%.Body mass index is 23.49 kg/(m^2).  General Appearance: Casual  Eye Contact::  Good  Speech:  Clear and Coherent  Volume:  Normal  Mood:  Irritable  Affect:  Congruent  Thought Process:  Goal Directed  Orientation:  Full (Time, Place, and Person)  Thought Content:  WDL  Suicidal Thoughts:  No  Homicidal Thoughts:  No  Memory:  NA  Judgement:  Fair  Insight:  Present  Psychomotor Activity:  Normal  Concentration:  Fair  Recall:  Good  Akathisia:  No  Handed:  Right  AIMS (if indicated):     Assets:  Communication Skills Desire for Improvement Financial Resources/Insurance [place] Talents/Skills Transportation Vocational/Educational  Sleep:      Treatment Plan Summary: Recommend out patient treatment  Disposition: 1. This patient does not report symptoms that would indicate that he is at increased risk for self harm or harm to others. 2. He is aware that his  drug abuse is likely the cause of his problems and inability to cope. He IS motivated to get help and is moving past the contemplation stage of making a change and attempting to initiate plans for recovery. Disposition Initial Assessment Completed for this Encounter: Yes Disposition of Patient:  1. D/C patient out with referrals for out patient substance abuse treatment. 2. After discussion with Dr. Shea Evans who agrees with this plan and disposition the above recommendation Are made. 3. EDMD made aware.  MASHBURN,NEIL 04/24/2014 1:52 PM  Reviewed information above and agree with treatment plan except where it is noted.  Ursula Alert ,MD Attending [facility]

## 2014-04-24 NOTE — ED Notes (Signed)
EDP Campos notified that PT V is refusing treatment for SI and wants to go home. VO for TTS eval given.

## 2014-04-24 NOTE — ED Notes (Signed)
Chelsea, [facility], advised Dr Ethelda ChickJacubowitz is requesting note from psych to be entered into chart. Chelsea advised will not be able to do so until tomorrow. Italyhad G, Consulting civil engineerCharge RN, discussing w/Chelsea and EDP.

## 2014-04-24 NOTE — Discharge Instructions (Signed)
Chemical Dependency Call any of the numbers that you were given to get help with her drug problem. If you have thoughts of harming yourself or someone else call 911 immediately Chemical dependency is an addiction to drugs or alcohol. It is characterized by the repeated behavior of seeking out and using drugs and alcohol despite harmful consequences to the health and safety of ones self and others.  RISK FACTORS There are certain situations or behaviors that increase a person's risk for chemical dependency. These include:  A family history of chemical dependency.  A history of mental health issues, including depression and anxiety.  A home environment where drugs and alcohol are easily available to you.  Drug or alcohol use at a young age. SYMPTOMS  The following symptoms can indicate chemical dependency:  Inability to limit the use of drugs or alcohol.  Nausea, sweating, shakiness, and anxiety that occurs when alcohol or drugs are not being used.  An increase in amount of drugs or alcohol that is necessary to get drunk or high. People who experience these symptoms can assess their use of drugs and alcohol by asking themselves the following questions:  Have you been told by friends or family that they are worried about your use of alcohol or drugs?  Do friends and family ever tell you about things you did while drinking alcohol or using drugs that you do not remember?  Do you lie about using alcohol or drugs or about the amounts you use?  Do you have difficulty completing daily tasks unless you use alcohol or drugs?  Is the level of your work or school performance lower because of your drug or alcohol use?  Do you get sick from using drugs or alcohol but keep using anyway?  Do you feel uncomfortable in social situations unless you use alcohol or drugs?  Do you use drugs or alcohol to help forget problems? An answer of yes to any of these questions may indicate chemical  dependency. Professional evaluation is suggested. Document Released: 06/25/2001 Document Revised: 09/23/2011 Document Reviewed: 09/06/2010 [facility] Patient Information 2015 [employer] [place]. This information is not intended to replace advice given to you by your health care provider. Make sure you discuss any questions you have with your health care provider.

## 2014-04-24 NOTE — BH Assessment (Signed)
[facility] Assessment Progress Note  CSW spoke with Dr. Rosalia Hammersay EDP who states that she is not on that unit, a different doctor is.  CSW than spoke with Dr. Felix PaciniJacobuwitz who states that PA's cannot discharge a pt and that this pt will need to be ran by a psychiatrist, before pt can d/c.  CSW than spoke with the psychiatrist on call, Dr. Elna BreslowEappen, who agreed with pt discharging today and already cosigned Verne SpurrNeil Mashburn, PA's note from earlier.  TTS faxed outpatient substance abuse providers earlier today for pt.    Reyes IvanChelsea Horton, LCSW 04/24/2014  5:55 PM

## 2014-04-24 NOTE — ED Notes (Signed)
Advised Chelsea, [facility], no one has advised EDP of recommended tx plan. She advised she will call Dr Rosalia Hammersay.

## 2014-04-24 NOTE — ED Notes (Signed)
[facility] contacted and informed of Pt voiced plan to go home and is refusing treatment for SI.

## 2014-04-24 NOTE — ED Notes (Signed)
PT TAKING SHOWER.

## 2014-04-24 NOTE — ED Provider Notes (Signed)
6:10 PM patient vehemently denies wanting to harm himself or anyone else. He is alert and oriented pleasant cooperative Glasgow Coma Score 15 not lightheaded on standing. He will be will be given outpatient referrals for substance abuse counseling Results for orders placed during the hospital encounter of 04/23/14  ETHANOL      Result Value Ref Range   Alcohol, Ethyl (B) 14 (*) 0 - 11 mg/dL  URINE RAPID DRUG SCREEN (HOSP PERFORMED)      Result Value Ref Range   Opiates NONE DETECTED  NONE DETECTED   Cocaine POSITIVE (*) NONE DETECTED   Benzodiazepines NONE DETECTED  NONE DETECTED   Amphetamines NONE DETECTED  NONE DETECTED   Tetrahydrocannabinol POSITIVE (*) NONE DETECTED   Barbiturates NONE DETECTED  NONE DETECTED  CBC WITH DIFFERENTIAL      Result Value Ref Range   WBC 6.0  4.0 - 10.5 K/uL   RBC 4.58  4.22 - 5.81 MIL/uL   Hemoglobin 13.0  13.0 - 17.0 g/dL   HCT 19.140.1  47.839.0 - 29.552.0 %   MCV 87.6  78.0 - 100.0 fL   MCH 28.4  26.0 - 34.0 pg   MCHC 32.4  30.0 - 36.0 g/dL   RDW 62.112.7  30.811.5 - 65.715.5 %   Platelets 213  150 - 400 K/uL   Neutrophils Relative % 61  43 - 77 %   Neutro Abs 3.7  1.7 - 7.7 K/uL   Lymphocytes Relative 31  12 - 46 %   Lymphs Abs 1.8  0.7 - 4.0 K/uL   Monocytes Relative 7  3 - 12 %   Monocytes Absolute 0.4  0.1 - 1.0 K/uL   Eosinophils Relative 1  0 - 5 %   Eosinophils Absolute 0.0  0.0 - 0.7 K/uL   Basophils Relative 0  0 - 1 %   Basophils Absolute 0.0  0.0 - 0.1 K/uL  COMPREHENSIVE METABOLIC PANEL      Result Value Ref Range   Sodium 143  137 - 147 mEq/L   Potassium 3.5 (*) 3.7 - 5.3 mEq/L   Chloride 103  96 - 112 mEq/L   CO2 27  19 - 32 mEq/L   Glucose, Bld 93  70 - 99 mg/dL   BUN 7  6 - 23 mg/dL   Creatinine, Ser 8.461.09  0.50 - 1.35 mg/dL   Calcium 9.1  8.4 - 96.210.5 mg/dL   Total Protein 6.4  6.0 - 8.3 g/dL   Albumin 3.8  3.5 - 5.2 g/dL   AST 22  0 - 37 U/L   ALT 13  0 - 53 U/L   Alkaline Phosphatase 65  39 - 117 U/L   Total Bilirubin 1.1  0.3 - 1.2  mg/dL   GFR calc non Af Amer >90  >90 mL/min   GFR calc Af Amer >90  >90 mL/min   Anion gap 13  5 - 15   No results found.   Doug SouSam Rindy Kollman, MD 04/24/14 1819

## 2014-07-19 ENCOUNTER — Encounter (HOSPITAL_COMMUNITY): Payer: Self-pay | Admitting: Emergency Medicine

## 2014-07-19 ENCOUNTER — Emergency Department (HOSPITAL_COMMUNITY)
Admission: EM | Admit: 2014-07-19 | Discharge: 2014-07-20 | Disposition: A | Discharge status: Home / Self Care | Payer: No Typology Code available for payment source | Attending: Emergency Medicine | Admitting: Emergency Medicine

## 2014-07-19 DIAGNOSIS — L739 Follicular disorder, unspecified: Secondary | ICD-10-CM | POA: Diagnosis not present

## 2014-07-19 DIAGNOSIS — Z720 Tobacco use: Secondary | ICD-10-CM | POA: Insufficient documentation

## 2014-07-19 DIAGNOSIS — J45909 Unspecified asthma, uncomplicated: Secondary | ICD-10-CM | POA: Diagnosis not present

## 2014-07-19 DIAGNOSIS — L0231 Cutaneous abscess of buttock: Secondary | ICD-10-CM | POA: Diagnosis present

## 2014-07-19 DIAGNOSIS — L03317 Cellulitis of buttock: Secondary | ICD-10-CM | POA: Diagnosis not present

## 2014-07-19 MED ORDER — LIDOCAINE HCL (PF) 1 % IJ SOLN
5.0000 mL | Freq: Once | INTRAMUSCULAR | Status: AC
  Administered 2014-07-19: 5 mL
  Filled 2014-07-19: qty 5

## 2014-07-19 NOTE — ED Provider Notes (Signed)
CSN: [id number]     Arrival date & time 07/19/14  2236 History   First MD Initiated Contact with Patient 07/19/14 2312     Chief Complaint  Patient presents with  . Abscess     (Consider location/radiation/quality/duration/timing/severity/associated sxs/prior Treatment) The history is provided by the patient. No language interpreter was used.  Curtis Miranda is a 29 y/o M with PMHx of asthma presenting to the ED with redness, swelling, and pain to the right buttock that started yesterday. Patient reported that the right buttock feels tight and that is a circular area of warmth upon palpation. Patient reported that he believes the area has gotten larger since yesterday. Patient stated that he is uncomfortable when he sits on his buttocks for long periods of time. Denied IV drug abuse. Denied fever, chills, drainage, weakness, numbness, tingling. PCP none  Past Medical History  Diagnosis Date  . Asthma    History reviewed. No pertinent past surgical history. No family history on file. History  Substance Use Topics  . Smoking status: Current Every Day Smoker    Types: Cigarettes  . Smokeless tobacco: Not on file  . Alcohol Use: Yes    Review of Systems  Constitutional: Negative for fever and chills.  Skin: Positive for color change.      Allergies  Review of patient's allergies indicates no known allergies.  Home Medications   Prior to Admission medications   Medication Sig Start Date End Date Taking? Authorizing Provider  cephALEXin (KEFLEX) 500 MG capsule Take 1 capsule (500 mg total) by mouth 2 (two) times daily. 07/20/14   Jaquavion Mccannon, PA-C  sulfamethoxazole-trimethoprim (BACTRIM DS,SEPTRA DS) 800-160 MG per tablet Take 1 tablet by mouth 2 (two) times daily. 07/20/14 07/27/14  Jkayla Spiewak, PA-C   BP 120/67 mmHg  Pulse 93  Temp(Src) 98.9 F (37.2 C) (Oral)  Resp 19  Ht  (1.702 m)  Wt 150 lb (68.04 kg)  BMI 23.49 kg/m2  SpO2 98% Physical Exam  Constitutional:  He is oriented to person, place, and time. He appears well-developed and well-nourished. No distress.  HENT:  Head: Normocephalic and atraumatic.  Eyes: Conjunctivae and EOM are normal. Right eye exhibits no discharge. Left eye exhibits no discharge.  Neck: Normal range of motion. Neck supple.  Cardiovascular: Normal rate, regular rhythm and normal heart sounds.  Exam reveals no friction rub.   No murmur heard. Pulses:      Radial pulses are 2+ on the right side, and 2+ on the left side.  Pulmonary/Chest: Effort normal and breath sounds normal. No respiratory distress. He has no wheezes. He has no rales.  Musculoskeletal: Normal range of motion.  Neurological: He is alert and oriented to person, place, and time. No cranial nerve deficit. He exhibits normal muscle tone. Coordination normal.  Skin: Skin is warm and dry. He is not diaphoretic. There is erythema.  Area of erythema identified localized to the right buttock measuring approximately 3 cm x 3 cm with tenderness upon palpation. Very mild induration with a negative fluctuance. Mildly warmth upon palpation. Very small pustule-appearing to be form of folliculitis identified in the center of the erythematous region. Negative active drainage or bleeding noted. Negative red streaks.  Nursing note and vitals reviewed.   ED Course  Procedures (including critical care time)  INCISION AND DRAINAGE Performed by: Raymon Mutton Consent: Verbal consent obtained. Risks and benefits: risks, benefits and alternatives were discussed Type: Pustule of the right buttock-folliculitis that has now turned into cellulitis  Body area: Right buttock Anesthesia: local infiltration Incision was made with a scalpel. Local anesthetic: lidocaine 1 % without epinephrine Anesthetic total: 3 ml - negative/pullback Complexity: complex Drainage: Small amount of blood  Drainage amount: Scant  Patient tolerance: Patient tolerated the procedure well with no  immediate complications.    Labs Review Labs Reviewed - No data to display  Imaging Review No results found.   EKG Interpretation None      MDM   Final diagnoses:  Folliculitis  Cellulitis of buttock    Medications  lidocaine (PF) (XYLOCAINE) 1 % injection 5 mL (5 mLs Infiltration Given 07/19/14 2342)   Filed Vitals:   07/19/14 2239 07/20/14 0022  BP: 136/88 120/67  Pulse: 110 93  Temp: 98.7 F (37.1 C) 98.9 F (37.2 C)  TempSrc: Oral Oral  Resp: 18 19  Height: 5\' 7"  (1.702 m)   Weight: 150 lb (68.04 kg)   SpO2: 100% 98%   Patient presenting to the ED with swelling, erythema, warmth upon palpation discomfort localized to the right buttock. Approximately 3 cm x 3 cm area of erythema with warmth upon palpation tenderness noted to the right buttock with a very tiny pustule noted in the center. Negative active drainage or bleeding. Negative fluctuance, very mild induration noted. Negative red streaks. I and D performed with no drainage-scant amount of blood noted. Suspicion to be folliculitis that is now turning into a localized cellulitis. Patient stable, afebrile. Patient not septic appearing. Discharged patient. Discharged patient with antibiotics. Discussed with patient to take antibiotics as prescribed and for patient to apply warm compressions at least 3 times per day. Recommended warm soaks. Discussed with patient to report back to the ED within 24-48 hours to be reassessed-reported that the antibiotics and heat can bring the cellulitis to form an abscess and may need to have I and D performed again. Patient understood and agreed to plan. Discussed with patient to closely monitor symptoms and if symptoms are to worsen or change to report back to the ED - strict return instructions given.  Patient agreed to plan of care, understood, all questions answered.   Raymon MuttonMarissa Redell Nazir, PA-C 07/20/14 [id number]  Gerhard Munchobert Lockwood, MD 07/20/14 [phone]

## 2014-07-19 NOTE — ED Notes (Signed)
Pt reports red hard area to R buttox. Denies drainage, fever/chills.

## 2014-07-20 MED ORDER — SULFAMETHOXAZOLE-TRIMETHOPRIM 800-160 MG PO TABS: 1.0000 | ORAL_TABLET | Freq: Two times a day (BID) | ORAL | Status: AC

## 2014-07-20 MED ORDER — CEPHALEXIN 500 MG PO CAPS: 500.0000 mg | ORAL_CAPSULE | Freq: Two times a day (BID) | ORAL | Status: DC

## 2014-07-20 NOTE — Discharge Instructions (Signed)
Please call your doctor for a followup appointment within 24-48 hours. When you talk to your doctor please let them know that you were seen in the emergency department and have them acquire all of your records so that they can discuss the findings with you and formulate a treatment plan to fully care for your new and ongoing problems. Please take antibiotics as prescribed and on a full stomach  Please rest and stay hydrated Please apply warm compressions to the site Please soak with warm bath Please report back to the Emergency Department within 24-48 hours to be seen and re-assessed - may need to have lanced again Please continue to monitor symptoms closely and if symptoms are to worsen or change (fever greater than 101, chills, sweating, nausea, vomiting, chest pain, shortness of breathe, difficulty breathing, weakness, numbness, tingling, worsening or changes to pain pattern, swelling to the buttocks, red streaks running down the buttocks to the leg, increased pain, numbness, tingling, loss of sensation to the leg) please report back to the Emergency Department immediately.  Folliculitis  Folliculitis is redness, soreness, and swelling (inflammation) of the hair follicles. This condition can occur anywhere on the body. People with weakened immune systems, diabetes, or obesity have a greater risk of getting folliculitis. CAUSES  Bacterial infection. This is the most common cause.  Fungal infection.  Viral infection.  Contact with certain chemicals, especially oils and tars. Long-term folliculitis can result from bacteria that live in the nostrils. The bacteria may trigger multiple outbreaks of folliculitis over time. SYMPTOMS Folliculitis most commonly occurs on the scalp, thighs, legs, back, buttocks, and areas where hair is shaved frequently. An early sign of folliculitis is a small, white or yellow, pus-filled, itchy lesion (pustule). These lesions appear on a red, inflamed follicle. They  are usually less than 0.2 inches (5 mm) wide. When there is an infection of the follicle that goes deeper, it becomes a boil or furuncle. A group of closely packed boils creates a larger lesion (carbuncle). Carbuncles tend to occur in hairy, sweaty areas of the body. DIAGNOSIS  Your caregiver can usually tell what is wrong by doing a physical exam. A sample may be taken from one of the lesions and tested in a lab. This can help determine what is causing your folliculitis. TREATMENT  Treatment may include:  Applying warm compresses to the affected areas.  Taking antibiotic medicines orally or applying them to the skin.  Draining the lesions if they contain a large amount of pus or fluid.  Laser hair removal for cases of long-lasting folliculitis. This helps to prevent regrowth of the hair. HOME CARE INSTRUCTIONS  Apply warm compresses to the affected areas as directed by your caregiver.  If antibiotics are prescribed, take them as directed. Finish them even if you start to feel better.  You may take over-the-counter medicines to relieve itching.  Do not shave irritated skin.  Follow up with your caregiver as directed. SEEK IMMEDIATE MEDICAL CARE IF:   You have increasing redness, swelling, or pain in the affected area.  You have a fever. MAKE SURE YOU:  Understand these instructions.  Will watch your condition.  Will get help right away if you are not doing well or get worse. Document Released: 09/09/2001 Document Revised: 12/31/2011 Document Reviewed: 10/01/2011 [facility] Patient Information 2015 [place], [place]. This information is not intended to replace advice given to you by your health care provider. Make sure you discuss any questions you have with your health care provider.  Abscess An abscess (boil or furuncle) is an infected area on or under the skin. This area is filled with yellowish-white fluid (pus) and other material (debris). HOME CARE   Only take medicines  as told by your doctor.  If you were given antibiotic medicine, take it as directed. Finish the medicine even if you start to feel better.  If gauze is used, follow your doctor's directions for changing the gauze.  To avoid spreading the infection:  Keep your abscess covered with a bandage.  Wash your hands well.  Do not share personal care items, towels, or whirlpools with others.  Avoid skin contact with others.  Keep your skin and clothes clean around the abscess.  Keep all doctor visits as told. GET HELP RIGHT AWAY IF:   You have more pain, puffiness (swelling), or redness in the wound site.  You have more fluid or blood coming from the wound site.  You have muscle aches, chills, or you feel sick.  You have a fever. MAKE SURE YOU:   Understand these instructions.  Will watch your condition.  Will get help right away if you are not doing well or get worse. Document Released: 12/18/2007 Document Revised: 12/31/2011 Document Reviewed: 09/13/2011 [facility] Patient Information 2015 [place]. This information is not intended to replace advice given to you by your health care provider. Make sure you discuss any questions you have with your health care provider. Cellulitis Cellulitis is an infection of the skin and the tissue under the skin. The infected area is usually red and tender. This happens most often in the arms and lower legs. HOME CARE   Take your antibiotic medicine as told. Finish the medicine even if you start to feel better.  Keep the infected arm or leg raised (elevated).  Put a warm cloth on the area up to 4 times per day.  Only take medicines as told by your doctor.  Keep all doctor visits as told. GET HELP IF:  You see red streaks on the skin coming from the infected area.  Your red area gets bigger or turns a dark color.  Your bone or joint under the infected area is painful after the skin heals.  Your infection comes back in the same  area or different area.  You have a puffy (swollen) bump in the infected area.  You have new symptoms.  You have a fever. GET HELP RIGHT AWAY IF:   You feel very sleepy.  You throw up (vomit) or have watery poop (diarrhea).  You feel sick and have muscle aches and pains. MAKE SURE YOU:   Understand these instructions.  Will watch your condition.  Will get help right away if you are not doing well or get worse. Document Released: 12/18/2007 Document Revised: 11/15/2013 Document Reviewed: 09/16/2011 [facility] Patient Information 2015 [place], [place]. This information is not intended to replace advice given to you by your health care provider. Make sure you discuss any questions you have with your health care provider.   Emergency Department Resource Guide 1) Find a Doctor and Pay Out of Pocket Although you won't have to find out who is covered by your insurance plan, it is a good idea to ask around and get recommendations. You will then need to call the office and see if the doctor you have chosen will accept you as a new patient and what types of options they offer for patients who are self-pay. Some doctors offer discounts or will set up payment  plans for their patients who do not have insurance, but you will need to ask so you aren't surprised when you get to your appointment.  2) Contact Your Local Health Department Not all health departments have doctors that can see patients for sick visits, but many do, so it is worth a call to see if yours does. If you don't know where your local health department is, you can check in your phone book. The CDC also has a tool to help you locate your state's health department, and many state websites also have listings of all of their local health departments.  3) Find a Walk-in Clinic If your illness is not likely to be very severe or complicated, you may want to try a walk in clinic. These are popping up all over the country in pharmacies,  drugstores, and shopping centers. They're usually staffed by nurse practitioners or physician assistants that have been trained to treat common illnesses and complaints. They're usually fairly quick and inexpensive. However, if you have serious medical issues or chronic medical problems, these are probably not your best option.  No Primary Care Doctor: - Call Health Connect at  [phone] - they can help you locate a primary care doctor that  accepts your insurance, provides certain services, etc. - Physician Referral Service- [phone]  Chronic Pain Problems: Organization         Address  Phone   Notes  [facility]  [phone] Patients need to be referred by their primary care doctor.   Medication Assistance: Organization         Address  Phone   Notes  [facility] Medication [facility] [address] [place] [place], [place] [address] [phone] --Must be a resident of [facility] -- Must have NO insurance coverage whatsoever (no Medicaid/ Medicare, etc.) -- The pt. MUST have a primary care doctor that directs their care regularly and follows them in the community   [facility]  [phone]   [employer]  [phone]    Agencies that provide inexpensive medical care: Organization         Address  Phone   Notes  [facility]  [phone]   [facility]    [phone]   [facility] [address] [place], [place] [address] [phone]   [facility] [address] [place] [place] [phone]   Planned Parenthood    [phone]   [facility]    [phone]   Community Health and [facility]  [address], [place] Phone:  [phone], Fax:  [phone] Hours of Operation:  9 am - 6 pm, M-F.  Also accepts Medicaid/Medicare and self-pay.  [facility]  [address], [place] Phone:  [phone], Fax: [phone]. Hours of Operation:  8:30 am - 5:30 pm, M-F.  Also accepts Medicaid and self-pay.  [facility] [place] [address] [place] Phone: [phone]   [facility] [address] [place] [address] Natasha Bence [place], [place] [phone], Ext. [phone] Mondays & Thursdays: 7-9 AM.  First 15 patients are seen on a first come, first serve basis.    Medicaid-accepting [facility] Providers:  Organization         Address  Phone   Notes  [facility] [address], [place] [phone] Also accepts self-pay  patients.  [facility] [address] Laurell Josephs [place], [place]  [phone]   [facility] [address], [place] [phone]   [facility] [address], [place] [phone]   Renaye Rakers [address], [place]   [phone] Only accepts [place] Access [place] patients after they have their name applied to their card.   Self-Pay (no insurance) in [facility]:  Organization         Address  Phone   Notes  Sickle Cell Patients, [facility] Internal Medicine [address] [place], [place] [phone]   [facility] [address] [place], [place] [phone]   [facility]  [address],  [phone]   [facility]/Dr. Osei-Bonsu  [address], [place] or [address], [place] [phone] Phone number for both [place] and [place] locations is the same.  [facility] [address] [place] [phone]   [facility] [address], [place] or [address] [phone]   [facility] [address] [place] [phone], phone; [phone], fax Sees patients 1st and 3rd Saturday of every month.  Must not qualify for public  or private insurance (i.e. Medicaid, Medicare, [place] Health Choice, Veterans' Benefits)  Household income should be no more than 200% of the poverty level The clinic cannot treat you if you are pregnant or think you are pregnant  Sexually transmitted diseases are not treated at the clinic.    Dental Care: Organization         Address  Phone  Notes  [facility] of [facility] [address] [place], [place] [phone] Accepts children up to age 41 who are enrolled in [place] or [place] Health Choice; pregnant women with a Medicaid card; and children who have applied for Medicaid or [place] Health Choice, but were declined, whose parents can pay a reduced fee at time of service.  [facility]  [address], [place] [phone] Accepts children up to age 35 who are enrolled in [place] or [place] Health Choice; pregnant women with a Medicaid card; and children who have applied for Medicaid or  Health Choice, but were declined, whose parents can pay a reduced fee at time of service.  [facility]  [address] [place], [place] [phone] Patients are seen by appointment only. Walk-ins are not accepted. [facility] will see patients 57 years of age and older. Monday - Tuesday (8am-5pm) Most Wednesdays (8:30-5pm) $30 per visit, cash only  [facility]  [address], [facility] [phone] Patients are seen by appointment only. Walk-ins are not accepted. [facility] will see patients 4 years of age and older. One Wednesday Evening (Monthly: Volunteer Based).  $30 per visit, cash only  [employer]  [phone] for adults; Children under age 8, call Graduate Pediatric Dentistry at [phone]. Children aged 40-14, please call [phone] to request a pediatric application.  Dental services are provided in all areas of  dental care including fillings, crowns and bridges, complete and partial dentures, implants, gum treatment, root canals, and extractions. Preventive care is also provided. Treatment is provided to  both adults and children. Patients are selected via a lottery and there is often a waiting list.   [facility] [address] [place]  [phone] [email or web address]   [facility] [address] [place], [place] [phone], Ext. [phone] Second and Fourth Thursday of each month, opens at 6:30 AM; Clinic ends at 9 AM.  Patients are seen on a first-come first-served basis, and a limited number are seen during each clinic.   [facility]  [address] Ether Griffins [place], [place] [phone]   Eligibility Requirements You must have lived in [place], [place], or [place] counties for at least the last three months.   You cannot be eligible for state or federal sponsored [place], including [employer], [place], or [employer].   You generally cannot be eligible for healthcare insurance through your employer.    How to apply: Eligibility screenings are held every Tuesday and Wednesday afternoon from 1:00 pm until 4:00 pm. You do not need an appointment for the interview!  [facility] [address], [place], [place] [phone]   [facility] Health Department  [phone]   [facility] Health Department  [phone]   [facility] Health Department  [phone]    Behavioral Health Resources in the Community: Intensive Outpatient Programs Organization         Address  Phone  Notes  [facility] [address], [place], [place] [phone]   [facility] Outpatient [address], [place], [place] [phone]   [facility] [address], [place], [place]  [phone]   [facility] Mental Health [address]  [place], [place] [phone] or  [phone]   Substance Abuse Resources Organization         Address  Phone  Notes  [facility]  [phone]   [facility]  [phone]   [facility] [place]  7470942096   Floydene Flock  [phone]   Residential & Outpatient Substance Abuse Program  [phone]   Psychological Services Organization         Address  Phone  Notes  [facility] Behavioral Health  [phone]   [facility] Services  [phone]   [facility] Mental Health [address], [place] [phone] or [phone]    Mobile Crisis Teams Organization         Address  Phone  Notes  [facility], Mobile Crisis Care Unit  [phone]   [facility]  [address] [place] [address] [place], [place] [phone]   Doristine Locks [address] [place] [phone]    Self-Help/Support Groups Organization         Address  Phone             Notes  [facility]oc[facility] of [place] - variety of support groups  336- I7437963 Call for more information  Narcotics Anonymous (NA), [facility] [address], [employer] [place]  2 meetings at this location   Statistician         Address  Phone  Notes  [facility] [address],    [place]  [phone]   [facility]  [address], [place] [address], [place], [place] [phone]   [facility] [address] [place], [place] [phone] Admissions: 8am-3pm M-F  [facility] [address],    [place], [place] [phone]  [facility] [address] Starling Manns [place], [place] [phone]   [facility] [address],  [place], [place] [phone]   [facility] - Intensive Outpatient [address] Laurell Josephs [address], [place], [place] [phone]   [facility] ([facility] [address] [place]  [place], [place] [phone] or [phone]   [facility]idential  Treatment Services (RTS) [address], [place], [place] [phone] Accepts Medicaid  [facility] [place] [address],  [place] [phone] Substance Abuse/Addiction Treatment   [facility] Organization         Address  Phone  Notes  [facility]  [phone]   Angie Fava, PhD [address] Ervin Knack [place], [place]   [phone]) [phone] or [phone]   [facility]   [address] [place], [place] [phone]   [facility] [address], [place], [place] [phone] Insurance/Medicaid/sponsorship through [facility] and Families [address]                                    [place], [place] [phone] Therapy/tele-psych/case  [facility] [address] [place], [place] [phone]    Dr. Lolly Mustache  [phone]   [facility] [place]  [employer] [facility] Dept. 1) [address], [place] 2) [address] [place] 3)  [address], [place] [phone]  [phone]   [facility] Child Abuse Hotline (507)510-2884 or [phone] (After Hours)

## 2014-07-23 ENCOUNTER — Encounter (HOSPITAL_COMMUNITY): Payer: Self-pay | Admitting: Emergency Medicine

## 2014-07-23 ENCOUNTER — Emergency Department (HOSPITAL_COMMUNITY)
Admission: EM | Admit: 2014-07-23 | Discharge: 2014-07-23 | Disposition: A | Discharge status: Home / Self Care | Payer: No Typology Code available for payment source | Attending: Emergency Medicine | Admitting: Emergency Medicine

## 2014-07-23 DIAGNOSIS — J45909 Unspecified asthma, uncomplicated: Secondary | ICD-10-CM | POA: Diagnosis not present

## 2014-07-23 DIAGNOSIS — R000 Tachycardia, unspecified: Secondary | ICD-10-CM | POA: Insufficient documentation

## 2014-07-23 DIAGNOSIS — L0231 Cutaneous abscess of buttock: Secondary | ICD-10-CM | POA: Insufficient documentation

## 2014-07-23 DIAGNOSIS — Z792 Long term (current) use of antibiotics: Secondary | ICD-10-CM | POA: Diagnosis not present

## 2014-07-23 DIAGNOSIS — Z720 Tobacco use: Secondary | ICD-10-CM | POA: Diagnosis not present

## 2014-07-23 HISTORY — DX: Follicular disorder, unspecified: L73.9

## 2014-07-23 MED ORDER — LIDOCAINE HCL 2 % IJ SOLN
5.0000 mL | Freq: Once | INTRAMUSCULAR | Status: AC
  Administered 2014-07-23: 100 mg
  Filled 2014-07-23: qty 20

## 2014-07-23 NOTE — ED Provider Notes (Signed)
CSN: [id number]     Arrival date & time 07/23/14  0107 History  This chart was scribed for Curtis Canal, MD by Bronson Curb, ED Scribe. This patient was seen in room A06C/A06C and the patient's care was started at 1:41 AM.     Chief Complaint  Patient presents with  . Abscess    The history is provided by the patient. No language interpreter was used.     HPI Comments: Curtis Miranda is a 29 y.o. male who presents to the Emergency Department complaining of abscess to the right buttock for the past 5 days. Patient was seen here 3 days ago for the same and was placed on keflex, bactrim. I and D was attempted but not much came out at that time. He reports that since this visit, the abscess came to "a head" and began draining after applying warm compresses. There is associated pain to the area. He denies fever, nausea, or vomiting.   Past Medical History  Diagnosis Date  . Asthma   . Folliculitis    History reviewed. No pertinent past surgical history. No family history on file. History  Substance Use Topics  . Smoking status: Current Every Day Smoker    Types: Cigarettes  . Smokeless tobacco: Not on file  . Alcohol Use: Yes    Review of Systems  Musculoskeletal: Positive for myalgias.  Skin: Positive for wound (abscess).  All other systems reviewed and are negative.     Allergies  Review of patient's allergies indicates no known allergies.  Home Medications   Prior to Admission medications   Medication Sig Start Date End Date Taking? Authorizing Provider  cephALEXin (KEFLEX) 500 MG capsule Take 1 capsule (500 mg total) by mouth 2 (two) times daily. 07/20/14   Marissa Sciacca, PA-C  sulfamethoxazole-trimethoprim (BACTRIM DS,SEPTRA DS) 800-160 MG per tablet Take 1 tablet by mouth 2 (two) times daily. 07/20/14 07/27/14  Marissa Sciacca, PA-C   Triage Vitals: BP 130/83 mmHg  Pulse 144  Temp(Src) 98.2 F (36.8 C) (Oral)  Resp 20  Ht  (1.702 m)  Wt 150 lb (68.04 kg)  BMI  23.49 kg/m2  SpO2 100%  Physical Exam  Constitutional: He is oriented to person, place, and time. He appears well-developed and well-nourished. No distress.  HENT:  Head: Normocephalic and atraumatic.  Eyes: Conjunctivae and EOM are normal.  Neck: Neck supple. No tracheal deviation present.  Cardiovascular: Normal rate.   Pulmonary/Chest: Effort normal. No respiratory distress.  Musculoskeletal: Normal range of motion.  Neurological: He is alert and oriented to person, place, and time.  Skin: Skin is warm and dry.  L hip area with erythema and fluctuance. + purulent drainage.   Psychiatric: He has a normal mood and affect. His behavior is normal.  Nursing note and vitals reviewed.   ED Course  Procedures (including critical care time)  DIAGNOSTIC STUDIES: Oxygen Saturation is 100% on room air, normal by my interpretation.    COORDINATION OF CARE: At 0145 Discussed treatment plan with patient which includes I&D. Patient agrees.   INCISION AND DRAINAGE PROCEDURE NOTE: Patient identification was confirmed and verbal consent was obtained. This procedure was performed by Curtis Canal, MD at 1:47 AM. Site: L hip Sterile procedures observed- no Anesthetic used (type and amt): 2% lildo, 5 cc Blade size: 10 Drainage: purulent Complexity: Complex Packing usednone Site anesthetized, incision made over site, wound drained and explored loculations, rinsed with copious amounts of normal saline, wound packed with sterile gauze,  covered with dry, sterile dressing.  Pt tolerated procedure well without complications.  Instructions for care discussed verbally and pt provided with additional written instructions for homecare and f/u.   Labs Review Labs Reviewed - No data to display  Imaging Review No results found.   EKG Interpretation None      MDM   Final diagnoses:  None    Curtis Miranda is a 29 y.o. male here with abscess. I and D performed with purulent drainage. Already  on abx. Was tachy on arrival, improved in the ED. Not septic appearing.    I personally performed the services described in this documentation, which was scribed in my presence. The recorded information has been reviewed and is accurate.    Curtis Canalavid H Johnston Maddocks, MD 07/25/14 [phone] [id number]

## 2014-07-23 NOTE — ED Notes (Signed)
Pt c/o on abscess to R buttock for over a week. Reports being seen her e and given antibiotics on January 5. Large abscess noted to R buttock; increased pain when walking or sitting

## 2014-07-23 NOTE — ED Notes (Signed)
Pt. Left with all belongings and refused wheelchair 

## 2014-07-23 NOTE — ED Notes (Signed)
Pt. reports recurrent abscess at right buttocks with drainage onset this week , seen here 07/19/2014 discharged home with prescription antibiotics .

## 2014-07-23 NOTE — Discharge Instructions (Signed)
Continue taking your antibiotics.   Change dressing when it gets soaked.   Follow up with your doctor.   Return to ER if you have fever, severe pain, worse drainage.

## 2015-05-01 IMAGING — US US ABDOMEN COMPLETE
1 series · 14 of 25 positions shown · non-contrast
Comparison: None.

CLINICAL DATA: Abdominal pain.  Nausea, vomiting and diarrhea.

EXAM:
ULTRASOUND ABDOMEN COMPLETE

[Series 1: us abdomen complete · 0.18mm/px · 14 of 78 slices shown]
[im 1/78]
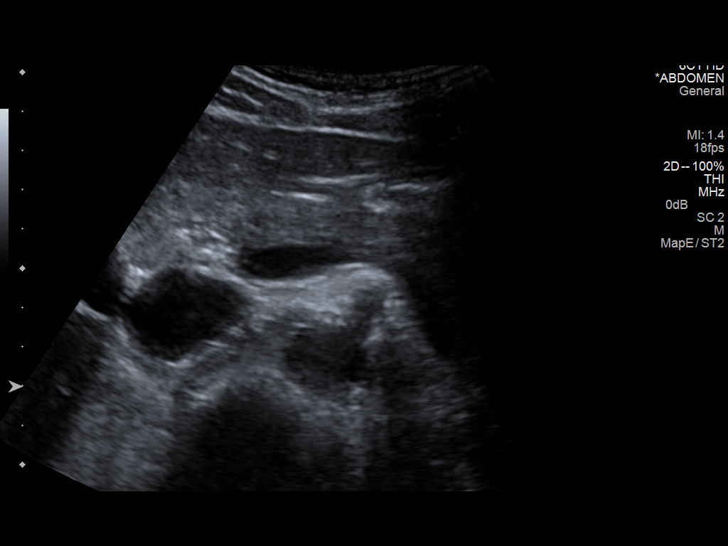
[im 7/78]
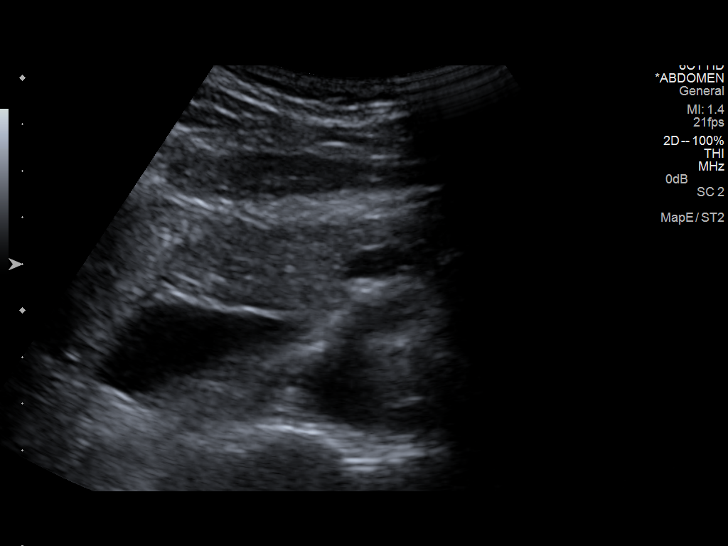
[im 13/78]
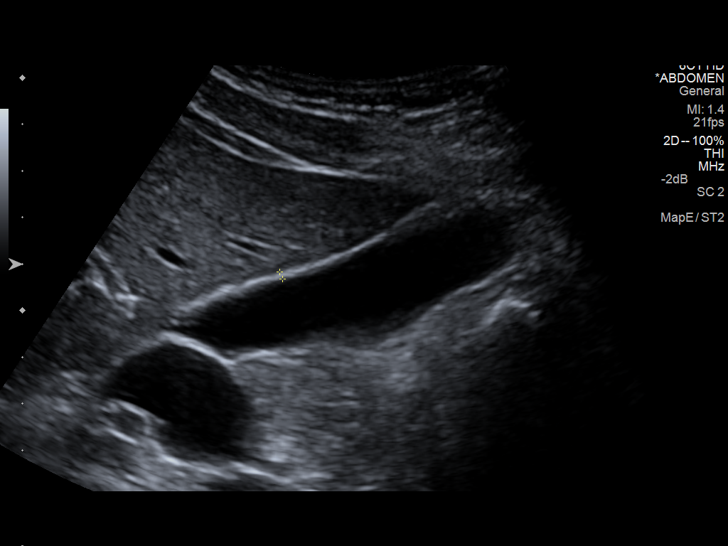
[im 20/78]
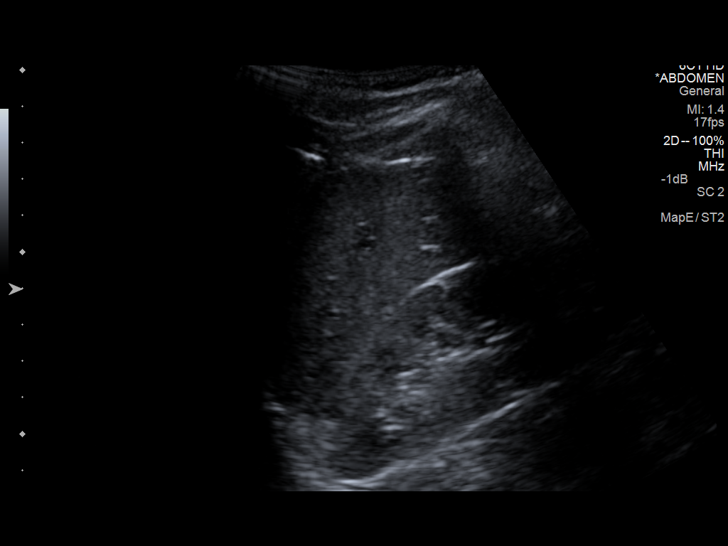
[im 26/78]
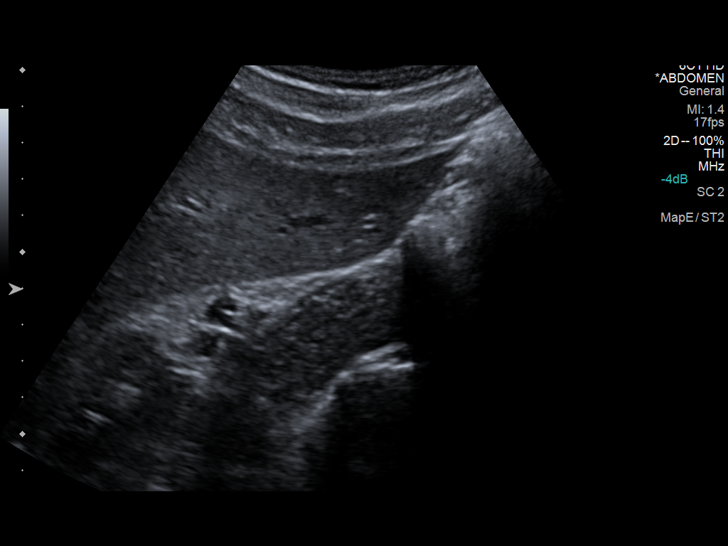
[im 29/78]
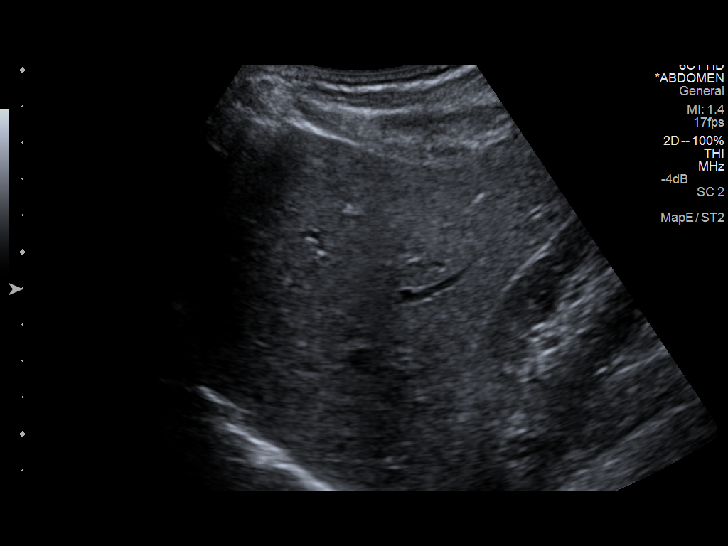
[im 36/78]
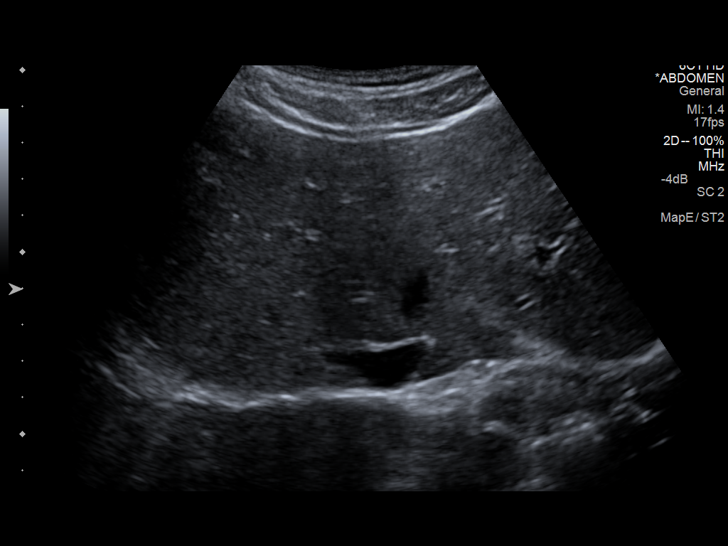
[im 42/78]
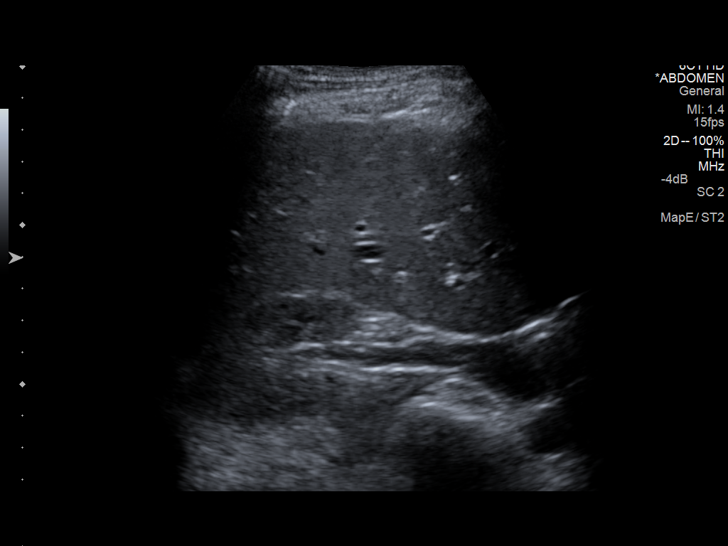
[im 49/78]
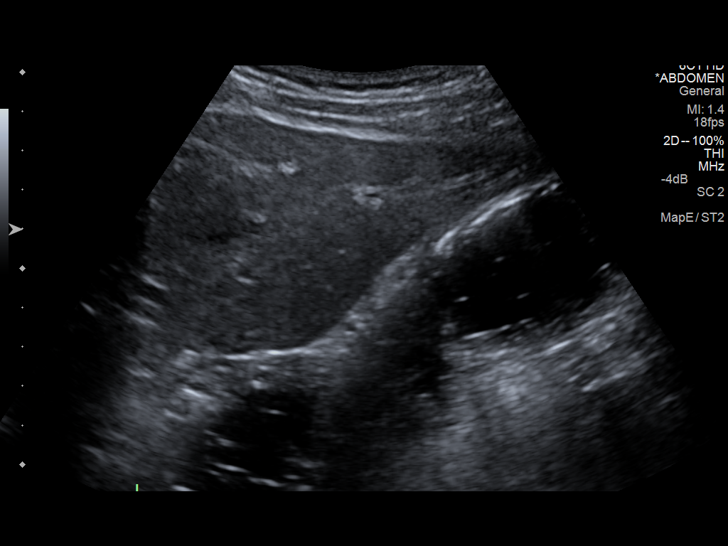
[im 52/78]
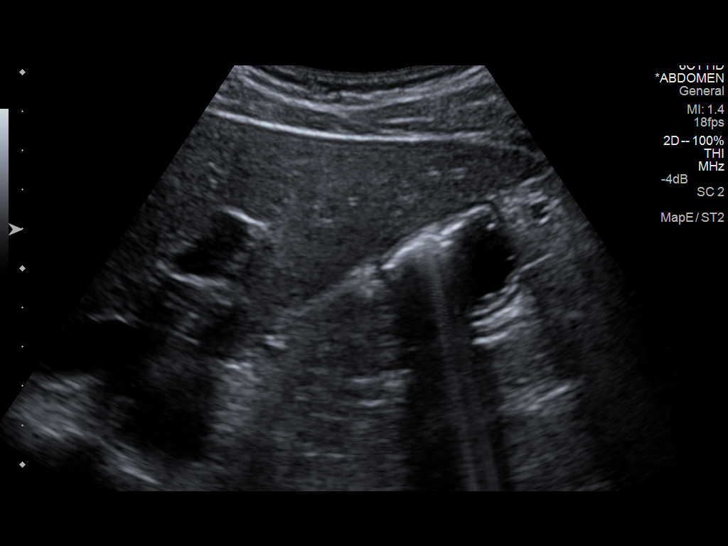
[im 58/78]
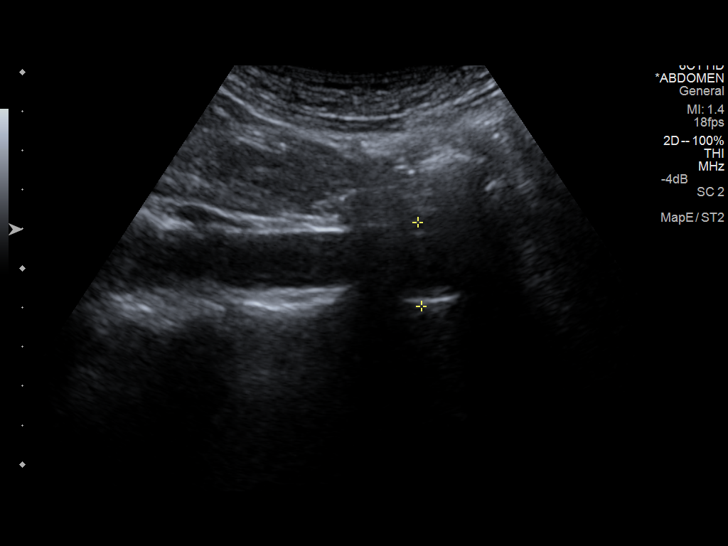
[im 65/78]
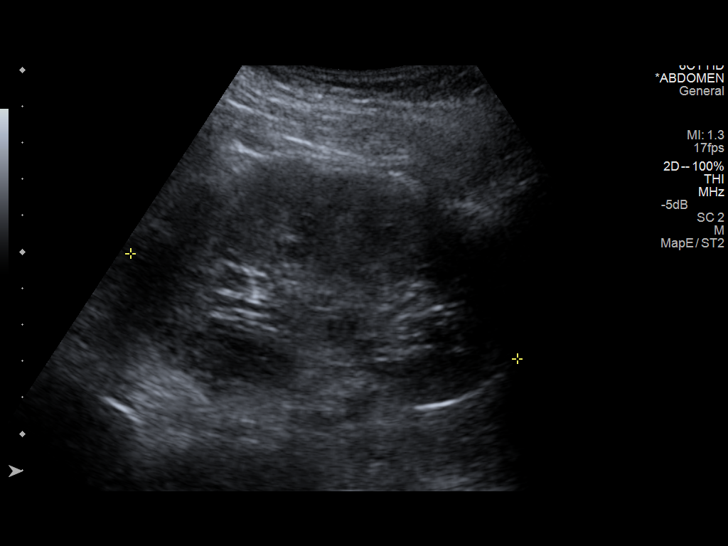
[im 71/78]
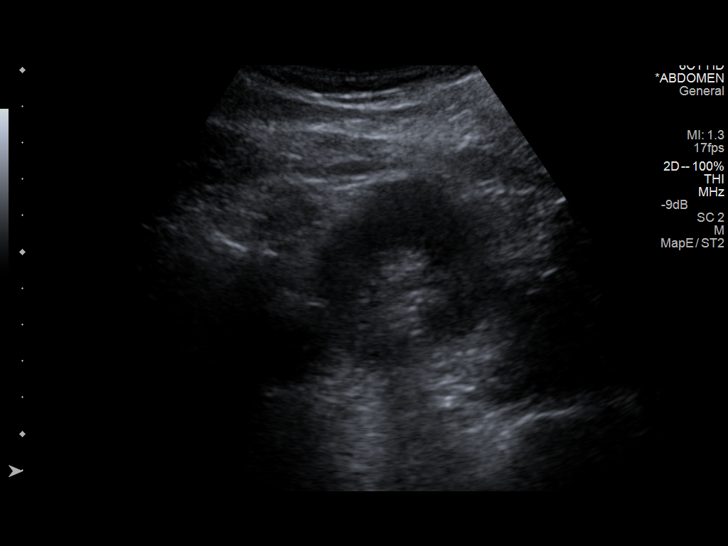
[im 78/78]
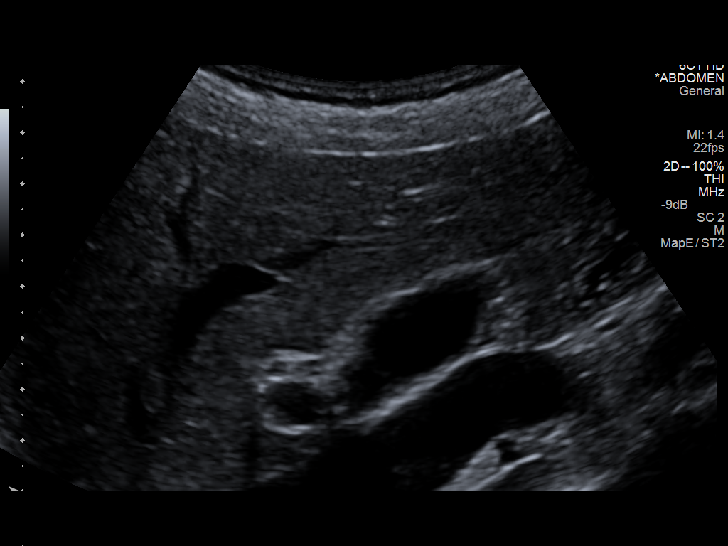

[14 of 25 positions shown; findings below may reference images not displayed]

FINDINGS: Gallbladder

No gallstones or wall thickening visualized. No sonographic Murphy
sign noted.

Common bile duct

Diameter: 0.2 cm; normal in caliber.

Liver

No focal lesion identified. Within normal limits in parenchymal
echogenicity.

IVC

No abnormality visualized.

Pancreas

Visualized portion unremarkable.

Spleen

Size and appearance within normal limits.

Right Kidney

Length: 11.4 cm. Echogenicity within normal limits. No mass or
hydronephrosis visualized.

Left Kidney

Length: 11.0 cm. Echogenicity within normal limits. No mass or
hydronephrosis visualized.

Abdominal aorta

No aneurysm visualized.
IMPRESSION: Unremarkable abdominal ultrasound.

## 2015-10-11 ENCOUNTER — Encounter (HOSPITAL_COMMUNITY): Payer: Self-pay

## 2015-10-11 ENCOUNTER — Emergency Department (HOSPITAL_COMMUNITY)
Admission: EM | Admit: 2015-10-11 | Discharge: 2015-10-11 | Disposition: A | Discharge status: Home / Self Care | Payer: BLUE CROSS/BLUE SHIELD | Attending: Emergency Medicine | Admitting: Emergency Medicine

## 2015-10-11 DIAGNOSIS — F17210 Nicotine dependence, cigarettes, uncomplicated: Secondary | ICD-10-CM | POA: Insufficient documentation

## 2015-10-11 DIAGNOSIS — Z872 Personal history of diseases of the skin and subcutaneous tissue: Secondary | ICD-10-CM | POA: Diagnosis not present

## 2015-10-11 DIAGNOSIS — M25531 Pain in right wrist: Secondary | ICD-10-CM | POA: Diagnosis not present

## 2015-10-11 DIAGNOSIS — J45909 Unspecified asthma, uncomplicated: Secondary | ICD-10-CM | POA: Diagnosis not present

## 2015-10-11 DIAGNOSIS — Z792 Long term (current) use of antibiotics: Secondary | ICD-10-CM | POA: Diagnosis not present

## 2015-10-11 DIAGNOSIS — M25532 Pain in left wrist: Secondary | ICD-10-CM | POA: Insufficient documentation

## 2015-10-11 MED ORDER — NAPROXEN 500 MG PO TABS
500.0000 mg | ORAL_TABLET | Freq: Once | ORAL | Status: AC
  Administered 2015-10-11: 500 mg via ORAL
  Filled 2015-10-11: qty 1

## 2015-10-11 MED ORDER — NAPROXEN 500 MG PO TABS: 500.0000 mg | ORAL_TABLET | Freq: Two times a day (BID) | ORAL | Status: DC

## 2015-10-11 NOTE — ED Notes (Signed)
Pt here with bilateral wrist pain. Has had for a month and worse since yesterday.  Pt is a cook at [employer].

## 2015-10-11 NOTE — Discharge Instructions (Signed)
Wrist Pain There are many things that can cause wrist pain. Some common causes include:  An injury to the wrist area, such as a sprain, strain, or fracture.  Overuse of the joint.  A condition that causes increased pressure on a nerve in the wrist (carpal tunnel syndrome).  Wear and tear of the joints that occurs with aging (osteoarthritis).  A variety of other types of arthritis. Sometimes, the cause of wrist pain is not known. The pain often goes away when you follow your health care provider's instructions for relieving pain at home. If your wrist pain continues, tests may need to be done to diagnose your condition. HOME CARE INSTRUCTIONS Pay attention to any changes in your symptoms. Take these actions to help with your pain:  Rest the wrist area for at least 48 hours or as told by your health care provider.  If directed, apply ice to the injured area:  Put ice in a plastic bag.  Place a towel between your skin and the bag.  Leave the ice on for 20 minutes, 2-3 times per day.  Keep your arm raised (elevated) above the level of your heart while you are sitting or lying down.  If a splint or elastic bandage has been applied, use it as told by your health care provider.  Remove the splint or bandage only as told by your health care provider.  Loosen the splint or bandage if your fingers become numb or have a tingling feeling, or if they turn cold or blue.  Take over-the-counter and prescription medicines only as told by your health care provider.  Keep all follow-up visits as told by your health care provider. This is important. SEEK MEDICAL CARE IF:  Your pain is not helped by treatment.  Your pain gets worse. SEEK IMMEDIATE MEDICAL CARE IF:  Your fingers become swollen.  Your fingers turn white, very red, or cold and blue.  Your fingers are numb or have a tingling feeling.  You have difficulty moving your fingers.   This information is not intended to replace  advice given to you by your health care provider. Make sure you discuss any questions you have with your health care provider.   Document Released: 04/10/2005 Document Revised: 03/22/2015 Document Reviewed: 11/16/2014 Elsevier Interactive Patient Education 2016 [employer]  

## 2015-10-11 NOTE — ED Provider Notes (Signed)
CSN: [id number]     Arrival date & time 10/11/15  1614 History  By signing my name below, I, Placido SouLogan Joldersma, attest that this documentation has been prepared under the direction and in the presence of Cheri FowlerKayla Deadra Diggins, PA-C. Electronically Signed: Placido SouLogan Joldersma, ED Scribe. 10/11/2015. 5:19 PM.   Chief Complaint  Patient presents with  . Wrist Pain   The history is provided by the patient. No language interpreter was used.    HPI Comments: Curtis Miranda is a 30 y.o. male who presents to the Emergency Department complaining of constant, mild, bilateral, dorsal wrist pain onset 3 months ago and worsening beginning yesterday. He denies any recent injuries to the affected regions, notes associated, mild, weakness to his bilateral hands due to the pain and intermittent, mild, wrist swelling. His pain worsens with wrist movement. He notes taking Asprin for pain management without significant relief. He denies a PMHx of DM. 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 He denies numbness or any other associated symptoms at this time. No fall/injury/trauma.    Past Medical History  Diagnosis Date  . Asthma   . Folliculitis    History reviewed. No pertinent past surgical history. History reviewed. No pertinent family history. Social History  Substance Use Topics  . Smoking status: Current Every Day Smoker    Types: Cigarettes  . Smokeless tobacco: None  . Alcohol Use: Yes    Review of Systems  Musculoskeletal: Positive for joint swelling and arthralgias.  Skin: Negative for wound.  Neurological: Positive for weakness (due to pain). Negative for numbness.  All other systems reviewed and are negative.  Allergies  Review of patient's allergies indicates no known allergies.  Home Medications   Prior to Admission medications   Medication Sig Start Date End Date Taking? Authorizing Provider  cephALEXin (KEFLEX) 500 MG capsule Take 1 capsule (500 mg total) by mouth 2 (two)  times daily. 07/20/14   Marissa Sciacca, PA-C  naproxen (NAPROSYN) 500 MG tablet Take 1 tablet (500 mg total) by mouth 2 (two) times daily. 10/11/15   Quantavia Frith, PA-C   BP 128/83 mmHg  Pulse 82  Temp(Src) 98.3 F (36.8 C) (Oral)  Resp 18  SpO2 95% Physical Exam  Constitutional: He is oriented to person, place, and time. He appears well-developed and well-nourished.  HENT:  Head: Normocephalic and atraumatic.  Right Ear: External ear normal.  Left Ear: External ear normal.  Eyes: Conjunctivae are normal. No scleral icterus.  Neck: No tracheal deviation present.  Cardiovascular:  Pulses:      Radial pulses are 2+ on the right side, and 2+ on the left side.  Capillary refill less than 3 seconds x 10.  Pulmonary/Chest: Effort normal. No respiratory distress.  Abdominal: He exhibits no distension.  Musculoskeletal: Normal range of motion.  Bilateral wrists without deformity, swelling, erythema, warmth, or ecchymosis.  TTP along dorsal wrist bilaterally. No medial tenderness.  Negative Tinnel sign. Negative Finkelstein test. FAROM in flexion/extension/ulnar and radial deviation bilaterally.   Neurological: He is alert and oriented to person, place, and time.  5/5 strength bilaterally in wrist and grip strength.  Sensation intact to light touch bilaterally.   Skin: Skin is warm and dry.  Psychiatric: He has a normal mood and affect. His behavior is normal.   ED Course  Procedures  DIAGNOSTIC STUDIES: Oxygen Saturation is 95% on RA, adequate by my interpretation.    COORDINATION OF CARE: 5:17 PM Discussed next steps with pt. He  verbalized understanding and is agreeable with the plan.   Labs Review Labs Reviewed - No data to display  Imaging Review No results found.   EKG Interpretation None      MDM   Final diagnoses:  Pain in both wrists   Patient presents with bilateral dorsal wrist pain x 3 months.  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  No injury or  trauma.  Neurovascularly intact.  FAROM.  No indication for imaging at this time.  Patient given naproxen.  Follow up orthopedics for persistent symptoms.  Discussed return precautions.  Patient agrees and acknowledges the above plan for discharge.   I personally performed the services described in this documentation, which was scribed in my presence. The recorded information has been reviewed and is accurate.    Cheri Fowler, PA-C 10/11/15 1729  Melene Plan, DO 10/11/15 2321

## 2020-09-18 ENCOUNTER — Other Ambulatory Visit: Payer: Self-pay

## 2020-09-18 ENCOUNTER — Emergency Department (HOSPITAL_COMMUNITY)
Admission: EM | Admit: 2020-09-18 | Discharge: 2020-09-18 | Disposition: A | Discharge status: Home / Self Care | Payer: No Typology Code available for payment source | Attending: Emergency Medicine | Admitting: Emergency Medicine

## 2020-09-18 ENCOUNTER — Encounter (HOSPITAL_COMMUNITY): Payer: Self-pay

## 2020-09-18 DIAGNOSIS — J45909 Unspecified asthma, uncomplicated: Secondary | ICD-10-CM | POA: Insufficient documentation

## 2020-09-18 DIAGNOSIS — K0889 Other specified disorders of teeth and supporting structures: Secondary | ICD-10-CM | POA: Insufficient documentation

## 2020-09-18 DIAGNOSIS — F17210 Nicotine dependence, cigarettes, uncomplicated: Secondary | ICD-10-CM | POA: Insufficient documentation

## 2020-09-18 MED ORDER — PENICILLIN V POTASSIUM 500 MG PO TABS: 500.0000 mg | ORAL_TABLET | Freq: Four times a day (QID) | ORAL | 0 refills | Status: AC

## 2020-09-18 NOTE — Discharge Instructions (Signed)
You were given a prescription for antibiotics. Please take the antibiotic prescription fully.   Please follow-up with a dentist in the next 5 to 7 days for reevaluation.  If you do not have a dentist, resources were provided for dentist in the area in your discharge summary.  Please contact one of the offices that are listed and make an appointment for follow-up.  Please return to the emergency department for any new or worsening symptoms.  

## 2020-09-18 NOTE — ED Triage Notes (Signed)
Pt c/o left sided dental pain (bottom) since Friday. Thinks he may have chipped a tooth. Pt also c/o possible infection/abscess on left side of mouth, states neck and jaw area "feel tight".

## 2020-09-18 NOTE — ED Triage Notes (Signed)
Pt states he took motrin 1 hr ago

## 2020-09-18 NOTE — ED Notes (Signed)
An After Visit Summary was printed and given to the patient. Discharge instructions given and no further questions at this time.  

## 2020-09-18 NOTE — ED Provider Notes (Signed)
[facility]-EMERGENCY DEPT Provider Note   CSN: [id number] Arrival date & time: 09/18/20  1550     History Chief Complaint  Patient presents with  . Dental Pain    Curtis Miranda is a 35 y.o. male.  HPI   35 year old male with a history of asthma, folliculitis, who presents to the emergency department today for evaluation of dental pain.  Complaining of pain to the left lower molars.  Started a few days ago.  He thinks that he fractured his tooth and that is why his pain is persisting.  He denies any fevers or systemic symptoms.  Past Medical History:  Diagnosis Date  . Asthma   . Folliculitis     There are no problems to display for this patient.   History reviewed. No pertinent surgical history.     No family history on file.  Social History   Tobacco Use  . Smoking status: Current Every Day Smoker    Packs/day: 1.00    Types: Cigarettes  . Smokeless tobacco: Never Used  Substance Use Topics  . Alcohol use: Yes    Comment: occ  . Drug use: Not Currently    Types: Marijuana    Home Medications Prior to Admission medications   Medication Sig Start Date End Date Taking? Authorizing Provider  penicillin v potassium (VEETID) 500 MG tablet Take 1 tablet (500 mg total) by mouth 4 (four) times daily for 7 days. 09/18/20 09/25/20 Yes Chrles Selley S, PA-C  naproxen (NAPROSYN) 500 MG tablet Take 1 tablet (500 mg total) by mouth 2 (two) times daily. 10/11/15   Cheri Fowler, PA-C    Allergies    Patient has no known allergies.  Review of Systems   Review of Systems  Constitutional: Negative for fever.  HENT: Positive for dental problem.     Physical Exam Updated Vital Signs BP 126/86 (BP Location: Right Arm)   Pulse 65   Temp 98.3 F (36.8 C) (Oral)   Resp 20   Ht 5\' 8"  (1.727 m)   Wt 90.7 kg   SpO2 96%   BMI 30.41 kg/m   Physical Exam Constitutional:      General: He is not in acute distress.    Appearance: He is well-developed and  well-nourished.  HENT:     Mouth/Throat:      Comments: Tooth is TTP and fractured. No periapical abscess. No sublingual or submandibular swelling Eyes:     Conjunctiva/sclera: Conjunctivae normal.  Cardiovascular:     Rate and Rhythm: Normal rate and regular rhythm.  Pulmonary:     Effort: Pulmonary effort is normal.     Breath sounds: Normal breath sounds.  Skin:    General: Skin is warm and dry.  Neurological:     Mental Status: He is alert and oriented to person, place, and time.     ED Results / Procedures / Treatments   Labs (all labs ordered are listed, but only abnormal results are displayed) Labs Reviewed - No data to display  EKG None  Radiology No results found.  Procedures Procedures   Medications Ordered in ED Medications - No data to display  ED Course  I have reviewed the triage vital signs and the nursing notes.  Pertinent labs & imaging results that were available during my care of the patient were reviewed by me and considered in my medical decision making (see chart for details).    MDM Rules/Calculators/A&P  Patient with toothache.  No gross abscess.  Exam unconcerning for Ludwig's angina or spread of infection.  Will treat with penicillin and pain medicine.  Urged patient to follow-up with dentist.    Final Clinical Impression(s) / ED Diagnoses Final diagnoses:  Pain, dental    Rx / DC Orders ED Discharge Orders         Ordered    penicillin v potassium (VEETID) 500 MG tablet  4 times daily        09/18/20 1633           Karrie Meres, PA-C 09/18/20 1633    Tegeler, Canary Brim, MD 09/18/20 1708

## 2020-09-19 ENCOUNTER — Other Ambulatory Visit: Payer: Self-pay

## 2020-09-19 ENCOUNTER — Emergency Department (HOSPITAL_COMMUNITY)
Admission: EM | Admit: 2020-09-19 | Discharge: 2020-09-19 | Disposition: A | Discharge status: Home / Self Care | Payer: No Typology Code available for payment source | Attending: Emergency Medicine | Admitting: Emergency Medicine

## 2020-09-19 ENCOUNTER — Encounter (HOSPITAL_COMMUNITY): Payer: Self-pay

## 2020-09-19 DIAGNOSIS — J45909 Unspecified asthma, uncomplicated: Secondary | ICD-10-CM | POA: Insufficient documentation

## 2020-09-19 DIAGNOSIS — F17210 Nicotine dependence, cigarettes, uncomplicated: Secondary | ICD-10-CM | POA: Insufficient documentation

## 2020-09-19 DIAGNOSIS — K047 Periapical abscess without sinus: Secondary | ICD-10-CM | POA: Insufficient documentation

## 2020-09-19 NOTE — ED Provider Notes (Signed)
[facility]-EMERGENCY DEPT Provider Note   CSN: [id number] Arrival date & time: 09/19/20  7371     History Chief Complaint  Patient presents with  . Dental Pain  . Otalgia    Curtis Miranda. is a 35 y.o. male with past medical history of dental pain and otalgia for the past 4 days.  Patient was recently seen in the emergency department yesterday for similar situation.  Complaining of pain to the left lower molars, denies any fevers, chills, nausea, vomiting.  Deniestrismus or sore throat drooling.  Patient states that he is having the same pain, however continued throughout the night and was unable to sleep throughout the night.  Previous provider did not provide him any narcotics, has been taking his penicillin as prescribed.  Denies any fevers, chills.  Does admit to a sore throat on the left side, also otalgia on the left side.  No otorrhea or tinnitus.  Patient states that he is now having a headache from this.  No blurry vision or neck pain.  No other complaints at this time.  Has not contacted a dentist yet.  HPI     Past Medical History:  Diagnosis Date  . Asthma   . Folliculitis     There are no problems to display for this patient.   History reviewed. No pertinent surgical history.     Family History  Family history unknown: Yes    Social History   Tobacco Use  . Smoking status: Current Every Day Smoker    Packs/day: 1.00    Types: Cigarettes  . Smokeless tobacco: Never Used  Vaping Use  . Vaping Use: Never used  Substance Use Topics  . Alcohol use: Yes    Comment: occ  . Drug use: Not Currently    Types: Marijuana    Home Medications Prior to Admission medications   Medication Sig Start Date End Date Taking? Authorizing Provider  naproxen (NAPROSYN) 500 MG tablet Take 1 tablet (500 mg total) by mouth 2 (two) times daily. 10/11/15   Cheri Fowler, PA-C  penicillin v potassium (VEETID) 500 MG tablet Take 1 tablet (500 mg  total) by mouth 4 (four) times daily for 7 days. 09/18/20 09/25/20  Couture, Cortni S, PA-C    Allergies    Patient has no known allergies.  Review of Systems   Review of Systems  Constitutional: Negative for chills, diaphoresis, fatigue and fever.  HENT: Positive for dental problem and ear pain. Negative for congestion, drooling, facial swelling, sore throat and trouble swallowing.   Eyes: Negative for pain and visual disturbance.  Respiratory: Negative for cough, shortness of breath and wheezing.   Cardiovascular: Negative for chest pain, palpitations and leg swelling.  Gastrointestinal: Negative for abdominal distention, abdominal pain, diarrhea, nausea and vomiting.  Genitourinary: Negative for difficulty urinating.  Musculoskeletal: Negative for back pain, neck pain and neck stiffness.  Skin: Negative for pallor.  Neurological: Negative for dizziness, speech difficulty, weakness and headaches.  Psychiatric/Behavioral: Negative for confusion.    Physical Exam Updated Vital Signs BP (!) 141/98   Pulse 78   Temp 98.1 F (36.7 C) (Oral)   Resp 18   Ht 5' 8.5" (1.74 m)   Wt 90.7 kg   SpO2 99%   BMI 29.97 kg/m   Physical Exam Constitutional:      General: He is not in acute distress.    Appearance: Normal appearance. He is not ill-appearing, toxic-appearing or diaphoretic.  HENT:  Head:      Comments: Patient with tenderness to palpation in area depicted above, no erythema.  No trismus.  No facial swelling.    Right Ear: Tympanic membrane, ear canal and external ear normal.     Left Ear: Tympanic membrane, ear canal and external ear normal. There is no impacted cerumen.     Mouth/Throat:      Comments: Patient with tenderness in this area, appears as if this tooth is chipped.  No abscess palpated.  No swelling under tongue. Cardiovascular:     Rate and Rhythm: Normal rate and regular rhythm.     Pulses: Normal pulses.  Pulmonary:     Effort: Pulmonary effort is  normal.     Breath sounds: Normal breath sounds.  Musculoskeletal:        General: Normal range of motion.  Skin:    General: Skin is warm and dry.     Capillary Refill: Capillary refill takes less than 2 seconds.  Neurological:     General: No focal deficit present.     Mental Status: He is alert and oriented to person, place, and time.  Psychiatric:        Mood and Affect: Mood normal.        Behavior: Behavior normal.        Thought Content: Thought content normal.     ED Results / Procedures / Treatments   Labs (all labs ordered are listed, but only abnormal results are displayed) Labs Reviewed - No data to display  EKG None  Radiology No results found.  Procedures Procedures   Medications Ordered in ED Medications - No data to display  ED Course  I have reviewed the triage vital signs and the nursing notes.  Pertinent labs & imaging results that were available during my care of the patient were reviewed by me and considered in my medical decision making (see chart for details).    MDM Rules/Calculators/A&P                          Patient presenting with dental infection, was seen here yesterday and placed on antibiotics.  Patient has taken 2 doses of his antibiotics, came here today due to continued pain.  No worsening of symptoms clinically, however patient states that he was unable to sleep at night due to the pain.  Has not gone to the dentist yet.  No concerns for deep space infection or Ludwig's.  No abscess palpated, symptomatic treatment discussed.  Will give patient small prescription of Percocet and have patient follow-up with dentist, will continue antibiotics.  Patient be discharged at this time.  Doubt need for further emergent work up at this time. I explained the diagnosis and have given explicit precautions to return to the ER including for any other new or worsening symptoms. The patient understands and accepts the medical plan as it's been dictated  and I have answered their questions. Discharge instructions concerning home care and prescriptions have been given. The patient is STABLE and is discharged to home in good condition.  Patient eloped before Percocet was prescribed, no pharmacy in file therefore cannot fill prescription.  Try calling the patient, he states that he does not want the medication anymore.  Final Clinical Impression(s) / ED Diagnoses Final diagnoses:  Dental infection    Rx / DC Orders ED Discharge Orders    None       Farrel Gordon, PA-C 09/19/20 1227  Derwood Kaplan, MD 09/20/20 [phone]

## 2020-09-19 NOTE — Discharge Instructions (Signed)
You are seen today for dental pain as you received yesterday.  Continue take your antibiotics, I did write you a prescription for Percocet, this will hold you until you see a dentist.  Do know that there is Tylenol and Percocet, do not take more than 4 g of Tylenol in a day.  You should take Tylenol as directed on bottle for pain and take the Percocet for breakthrough pain.  Please speak to your pharmacist about all side effects of this medication including drowsiness, do not drive on this medication.  If you have any new worsening concerning symptoms please come back to the emergency department.  Get help right away if: You have a fever or chills. Your symptoms suddenly get worse. You have a very bad headache. You have problems breathing or swallowing. You have trouble opening your mouth. You have swelling in your neck or close to your eye.

## 2020-09-19 NOTE — ED Triage Notes (Signed)
Patient c/o left lower dental pain and left ear pain x 4 days.

## 2020-09-19 NOTE — ED Notes (Signed)
Pt eloped, pt did not inform staff he was leaving. Farrel Gordon, PA aware.

## 2021-02-02 ENCOUNTER — Encounter (HOSPITAL_COMMUNITY): Payer: Self-pay

## 2021-02-02 ENCOUNTER — Other Ambulatory Visit: Payer: Self-pay

## 2021-02-02 ENCOUNTER — Emergency Department (HOSPITAL_COMMUNITY): Payer: Self-pay

## 2021-02-02 ENCOUNTER — Emergency Department (HOSPITAL_COMMUNITY)
Admission: EM | Admit: 2021-02-02 | Discharge: 2021-02-02 | Disposition: A | Discharge status: Home / Self Care | Payer: Self-pay | Attending: Emergency Medicine | Admitting: Emergency Medicine

## 2021-02-02 DIAGNOSIS — R0602 Shortness of breath: Secondary | ICD-10-CM | POA: Insufficient documentation

## 2021-02-02 DIAGNOSIS — Z5321 Procedure and treatment not carried out due to patient leaving prior to being seen by health care provider: Secondary | ICD-10-CM | POA: Insufficient documentation

## 2021-02-02 DIAGNOSIS — R079 Chest pain, unspecified: Secondary | ICD-10-CM | POA: Insufficient documentation

## 2021-02-02 DIAGNOSIS — K0889 Other specified disorders of teeth and supporting structures: Secondary | ICD-10-CM | POA: Insufficient documentation

## 2021-02-02 LAB — CBC
HCT: 43.6 % (ref 39.0–52.0)
Hemoglobin: 14 g/dL (ref 13.0–17.0)
MCH: 27.9 pg (ref 26.0–34.0)
MCHC: 32.1 g/dL (ref 30.0–36.0)
MCV: 87 fL (ref 80.0–100.0)
Platelets: 268 10*3/uL (ref 150–400)
RBC: 5.01 MIL/uL (ref 4.22–5.81)
RDW: 13.2 % (ref 11.5–15.5)
WBC: 11.7 10*3/uL — ABNORMAL HIGH (ref 4.0–10.5)
nRBC: 0 % (ref 0.0–0.2)

## 2021-02-02 LAB — BASIC METABOLIC PANEL
Anion gap: 10 (ref 5–15)
BUN: 12 mg/dL (ref 6–20)
CO2: 24 mmol/L (ref 22–32)
Calcium: 9.6 mg/dL (ref 8.9–10.3)
Chloride: 106 mmol/L (ref 98–111)
Creatinine, Ser: 1.02 mg/dL (ref 0.61–1.24)
GFR, Estimated: >60 mL/min (ref >60)
Glucose, Bld: 106 mg/dL — ABNORMAL HIGH (ref 70–99)
Potassium: 3.5 mmol/L (ref 3.5–5.1)
Sodium: 140 mmol/L (ref 135–145)

## 2021-02-02 LAB — TROPONIN I (HIGH SENSITIVITY): Troponin I (High Sensitivity): 3 ng/L (ref <18)

## 2021-02-02 NOTE — ED Triage Notes (Addendum)
Patient reports mid chest pain since 1300 today. Patient states while driving to the ED this afaternoon he began having SOB and feeling numbness around his mouth and hands.  Patient c/o left lower dental pain since yesterday.

## 2021-02-02 NOTE — ED Notes (Signed)
Pt reports they are leaving and turned in their labels 

## 2021-02-02 NOTE — ED Provider Notes (Signed)
Emergency Medicine Provider Triage Evaluation Note  Curtis Miranda. , a 35 y.o. male  was evaluated in triage.  Pt complains of CP, dental pain. Dental pain began yesterday. CP began on was to ED. No radiation. Some HA. Feeling tingling to bilateral extremities and to perioral area. No hx of PE, DVt  Review of Systems  Positive: CP, Dental pain, numbness Negative: Weakness, vision changes, back pain, LE edems  Physical Exam  BP (!) 141/86 (BP Location: Left Arm)   Pulse 100   Temp 98.1 F (36.7 C) (Oral)   Resp 19   Ht 5\' 8"  (1.727 m)   Wt 90.7 kg   SpO2 100%   BMI 30.41 kg/m  Gen:   Awake, no distress   Resp:  Normal effort  MSK:   Moves extremities without difficulty  Other:  CN 2-12 grossly intact  Medical Decision Making  Medically screening exam initiated at 4:58 PM.  Appropriate orders placed.  Curtis Miranda. was informed that the remainder of the evaluation will be completed by another provider, this initial triage assessment does not replace that evaluation, and the importance of remaining in the ED until their evaluation is complete.  CP, Dental pain, Numbness   Curtis Miranda A, PA-C 02/02/21 1700    02/04/21, MD 02/03/21 1440

## 2021-02-03 ENCOUNTER — Emergency Department (HOSPITAL_BASED_OUTPATIENT_CLINIC_OR_DEPARTMENT_OTHER)
Admission: EM | Admit: 2021-02-03 | Discharge: 2021-02-03 | Disposition: A | Discharge status: Home / Self Care | Payer: Self-pay | Attending: Emergency Medicine | Admitting: Emergency Medicine

## 2021-02-03 ENCOUNTER — Encounter (HOSPITAL_BASED_OUTPATIENT_CLINIC_OR_DEPARTMENT_OTHER): Payer: Self-pay | Admitting: Emergency Medicine

## 2021-02-03 DIAGNOSIS — F17210 Nicotine dependence, cigarettes, uncomplicated: Secondary | ICD-10-CM | POA: Insufficient documentation

## 2021-02-03 DIAGNOSIS — J45909 Unspecified asthma, uncomplicated: Secondary | ICD-10-CM | POA: Insufficient documentation

## 2021-02-03 DIAGNOSIS — M272 Inflammatory conditions of jaws: Secondary | ICD-10-CM | POA: Insufficient documentation

## 2021-02-03 DIAGNOSIS — K029 Dental caries, unspecified: Secondary | ICD-10-CM | POA: Insufficient documentation

## 2021-02-03 MED ORDER — BUPIVACAINE-EPINEPHRINE (PF) 0.5% -1:200000 IJ SOLN
1.8000 mL | Freq: Once | INTRAMUSCULAR | Status: AC
  Administered 2021-02-03: 1.8 mL
  Filled 2021-02-03: qty 1.8

## 2021-02-03 MED ORDER — AMOXICILLIN 500 MG PO CAPS
500.0000 mg | ORAL_CAPSULE | Freq: Once | ORAL | Status: AC
  Administered 2021-02-03: 500 mg via ORAL
  Filled 2021-02-03: qty 1

## 2021-02-03 MED ORDER — HYDROCODONE-ACETAMINOPHEN 5-325 MG PO TABS: 1.0000 | ORAL_TABLET | Freq: Four times a day (QID) | ORAL | 0 refills | Status: DC | PRN

## 2021-02-03 MED ORDER — AMOXICILLIN 500 MG PO CAPS: 500.0000 mg | ORAL_CAPSULE | Freq: Three times a day (TID) | ORAL | 0 refills | Status: DC

## 2021-02-03 NOTE — ED Triage Notes (Signed)
  Patient comes in with dental pain that has been going on for a few days.  Patient states one of his molars on the bottom left side of his mouth has a hole in it and it hurts a lot.  Feels swelling in his jaw and is concerned for abscess.  Patient taking 500 mg tylenol with last dose at 0445 before arrival.  Pain 10/10, throbbing pain.

## 2021-02-03 NOTE — ED Provider Notes (Signed)
[facility] Provider Note: Lowella Dell, MD, FACEP  CSN: [id number] MRN: [id number] ARRIVAL: 02/03/21 at 0502 ROOM: DB012/DB012   CHIEF COMPLAINT  Dental Pain   HISTORY OF PRESENT ILLNESS  02/03/21 5:10 AM Curtis Miranda. is a 35 y.o. male with a carious left lower second molar.  It has been carious for "a while" and he was seen for the same tooth back in March of this year but due to finances was unable to have it pulled.  The tooth began hurting again 2 days ago.  There is also much some swelling of the adjacent gum.  He rates his pain as a 10 out of 10, throbbing in nature.  Is worse with eating or drinking.  It is severe enough to keep him from sleeping.  He has taken Tylenol without relief.   Past Medical History:  Diagnosis Date   Asthma    Folliculitis     History reviewed. No pertinent surgical history.  Family History  Family history unknown: Yes    Social History   Tobacco Use   Smoking status: Every Day    Packs/day: 0.10    Types: Cigarettes   Smokeless tobacco: Never  Vaping Use   Vaping Use: Never used  Substance Use Topics   Alcohol use: Yes    Comment: occ   Drug use: Not Currently    Types: Marijuana    Prior to Admission medications   Medication Sig Start Date End Date Taking? Authorizing Provider  amoxicillin (AMOXIL) 500 MG capsule Take 1 capsule (500 mg total) by mouth 3 (three) times daily. 02/03/21  Yes Rubert Frediani, MD  HYDROcodone-acetaminophen (NORCO) 5-325 MG tablet Take 1 tablet by mouth every 6 (six) hours as needed for severe pain. 02/03/21  Yes Ayeisha Lindenberger, MD    Allergies Patient has no known allergies.   REVIEW OF SYSTEMS  Negative except as noted here or in the History of Present Illness.   PHYSICAL EXAMINATION  Initial Vital Signs Blood pressure (!) 144/98, pulse 80, temperature 98.8 F (37.1 C), temperature source Oral, resp. rate 16, height 5\' 8"  (1.727 m), weight 90.7 kg, SpO2 100  %.  Examination General: Well-developed, well-nourished male in no acute distress; appearance consistent with age of record HENT: normocephalic; atraumatic; carious left lower second molar with tenderness and swelling of the adjacent gum Eyes: Normal appearance Neck: supple; no lymphadenopathy Heart: regular rate and rhythm Lungs: clear to auscultation bilaterally Abdomen: soft; nondistended Extremities: No deformity; full range of motion Neurologic: Awake, alert and oriented; motor function intact in all extremities and symmetric; no facial droop Skin: Warm and dry Psychiatric: Normal mood and affect   RESULTS  Summary of this visit's results, reviewed and interpreted by myself:   EKG Interpretation  Date/Time:    Ventricular Rate:    PR Interval:    QRS Duration:   QT Interval:    QTC Calculation:   R Axis:     Text Interpretation:         Laboratory Studies: Results for orders placed or performed during the hospital encounter of 02/02/21 (from the past 24 hour(s))  Basic metabolic panel     Status: Abnormal   Collection Time: 02/02/21  5:07 PM  Result Value Ref Range   Sodium 140 135 - 145 mmol/L   Potassium 3.5 3.5 - 5.1 mmol/L   Chloride 106 98 - 111 mmol/L   CO2 24 22 - 32 mmol/L   Glucose, Bld 106 (H) 70 -  99 mg/dL   BUN 12 6 - 20 mg/dL   Creatinine, Ser 4.09 0.61 - 1.24 mg/dL   Calcium 9.6 8.9 - 81.1 mg/dL   GFR, Estimated >91 >47 mL/min   Anion gap 10 5 - 15  CBC     Status: Abnormal   Collection Time: 02/02/21  5:07 PM  Result Value Ref Range   WBC 11.7 (H) 4.0 - 10.5 K/uL   RBC 5.01 4.22 - 5.81 MIL/uL   Hemoglobin 14.0 13.0 - 17.0 g/dL   HCT 82.9 56.2 - 13.0 %   MCV 87.0 80.0 - 100.0 fL   MCH 27.9 26.0 - 34.0 pg   MCHC 32.1 30.0 - 36.0 g/dL   RDW 86.5 78.4 - 69.6 %   Platelets 268 150 - 400 K/uL   nRBC 0.0 0.0 - 0.2 %  Troponin I (High Sensitivity)     Status: None   Collection Time: 02/02/21  5:07 PM  Result Value Ref Range   Troponin I  (High Sensitivity) 3 <18 ng/L   Imaging Studies: DG Chest 2 View  Result Date: 02/02/2021 CLINICAL DATA:  Chest pain and shortness of breath. EXAM: CHEST - 2 VIEW COMPARISON:  None. FINDINGS: The cardiomediastinal contours are normal. Mild lower lobe predominant peribronchial thickening. Pulmonary vasculature is normal. No consolidation, pleural effusion, or pneumothorax. No acute osseous abnormalities are seen. IMPRESSION: Mild lower lobe predominant peribronchial thickening suggesting bronchitis or asthma. Electronically Signed   By: Narda Rutherford M.D.   On: 02/02/2021 17:42    ED COURSE and MDM  Nursing notes, initial and subsequent vitals signs, including pulse oximetry, reviewed and interpreted by myself.  Vitals:   02/03/21 0509 02/03/21 0510  BP: (!) 144/98   Pulse: 80   Resp: 16   Temp: 98.8 F (37.1 C)   TempSrc: Oral   SpO2: 100%   Weight:  90.7 kg  Height:  5\' 8"  (1.727 m)   Medications  amoxicillin (AMOXIL) capsule 500 mg (has no administration in time range)  bupivacaine-epinephrine (MARCAINE W/ EPI) 0.5% -1:200000 injection 1.8 mL (1.8 mLs Infiltration Given 02/03/21 0517)    The patient's presentation is consistent with an odontogenic infection, possibly an apical abscess.  We will start him on amoxicillin and refer him to the dentist and/or oral surgeon on-call.  PROCEDURES  Procedures DENTAL BLOCK 1.8 milliliters of 0.5% bupivacaine with epinephrine were injected into the buccal fold adjacent to the left lower second molar. The patient tolerated this well and there were no immediate complications. Adequate analgesia was obtained.   ED DIAGNOSES     ICD-10-CM   1. Pain due to dental caries  K02.9     2. Odontogenic infection of jaw  M27.2          Curtis Bangs, MD 02/03/21 [phone]

## 2021-05-07 ENCOUNTER — Other Ambulatory Visit: Payer: Self-pay

## 2021-05-07 ENCOUNTER — Emergency Department (HOSPITAL_BASED_OUTPATIENT_CLINIC_OR_DEPARTMENT_OTHER)
Admission: EM | Admit: 2021-05-07 | Discharge: 2021-05-07 | Disposition: A | Discharge status: Home / Self Care | Payer: Self-pay | Attending: Student | Admitting: Student

## 2021-05-07 DIAGNOSIS — K029 Dental caries, unspecified: Secondary | ICD-10-CM | POA: Insufficient documentation

## 2021-05-07 DIAGNOSIS — F17210 Nicotine dependence, cigarettes, uncomplicated: Secondary | ICD-10-CM | POA: Insufficient documentation

## 2021-05-07 DIAGNOSIS — J45909 Unspecified asthma, uncomplicated: Secondary | ICD-10-CM | POA: Insufficient documentation

## 2021-05-07 MED ORDER — KETOROLAC TROMETHAMINE 15 MG/ML IJ SOLN
15.0000 mg | Freq: Once | INTRAMUSCULAR | Status: AC
  Administered 2021-05-07: 15 mg via INTRAMUSCULAR
  Filled 2021-05-07: qty 1

## 2021-05-07 MED ORDER — PENICILLIN V POTASSIUM 500 MG PO TABS: 500.0000 mg | ORAL_TABLET | Freq: Four times a day (QID) | ORAL | 0 refills | Status: AC

## 2021-05-07 MED ORDER — PENICILLIN V POTASSIUM 250 MG PO TABS
500.0000 mg | ORAL_TABLET | Freq: Once | ORAL | Status: AC
  Administered 2021-05-07: 500 mg via ORAL
  Filled 2021-05-07: qty 2

## 2021-05-07 NOTE — ED Provider Notes (Signed)
[facility] EMERGENCY DEPT Provider Note   CSN: [id number] Arrival date & time: 05/07/21  8413     History Chief Complaint  Patient presents with   Dental Pain    Curtis Miranda. is a 35 y.o. male who presents emergency department for evaluation of dental pain.  Patient states that he is having 10 out of 10 dental pain in the left lower jaw that radiates to the center of the jaw.  States the pain has been gradually worsening over last 48 hours and significantly worsened this morning.  He denies fever, chest pain, shortness of breath, abdominal pain, nausea, vomiting or other systemic symptoms.  He states that he believes he has a dental infection as he is having some blood with brushing.  He states that he took ibuprofen last night with mild improvement but it worsened significantly this morning.   Dental Pain Associated symptoms: no fever       Past Medical History:  Diagnosis Date   Asthma    Folliculitis     There are no problems to display for this patient.   No past surgical history on file.     Family History  Family history unknown: Yes    Social History   Tobacco Use   Smoking status: Every Day    Packs/day: 0.10    Types: Cigarettes   Smokeless tobacco: Never  Vaping Use   Vaping Use: Never used  Substance Use Topics   Alcohol use: Yes    Comment: occ   Drug use: Not Currently    Types: Marijuana    Home Medications Prior to Admission medications   Medication Sig Start Date End Date Taking? Authorizing Provider  amoxicillin (AMOXIL) 500 MG capsule Take 1 capsule (500 mg total) by mouth 3 (three) times daily. 02/03/21   Molpus, John, MD  HYDROcodone-acetaminophen (NORCO) 5-325 MG tablet Take 1 tablet by mouth every 6 (six) hours as needed for severe pain. 02/03/21   Molpus, Jonny Ruiz, MD    Allergies    Patient has no known allergies.  Review of Systems   Review of Systems  Constitutional:  Negative for chills and fever.   HENT:  Positive for dental problem. Negative for ear pain and sore throat.   Eyes:  Negative for pain and visual disturbance.  Respiratory:  Negative for cough and shortness of breath.   Cardiovascular:  Negative for chest pain and palpitations.  Gastrointestinal:  Negative for abdominal pain and vomiting.  Genitourinary:  Negative for dysuria and hematuria.  Musculoskeletal:  Negative for arthralgias and back pain.  Skin:  Negative for color change and rash.  Neurological:  Negative for seizures and syncope.  All other systems reviewed and are negative.  Physical Exam Updated Vital Signs BP 128/89 (BP Location: Right Arm)   Pulse 77   Temp 98 F (36.7 C) (Oral)   Resp 20   Ht 5\' 8"  (1.727 m)   Wt 86.2 kg   SpO2 100%   BMI 28.89 kg/m   Physical Exam Vitals and nursing note reviewed.  Constitutional:      Appearance: He is well-developed.  HENT:     Head: Normocephalic and atraumatic.     Comments: Dental caries with significant decay at tooth #19 Eyes:     Conjunctiva/sclera: Conjunctivae normal.  Cardiovascular:     Rate and Rhythm: Normal rate and regular rhythm.     Heart sounds: No murmur heard. Pulmonary:     Effort: Pulmonary effort is  normal. No respiratory distress.     Breath sounds: Normal breath sounds.  Abdominal:     Palpations: Abdomen is soft.     Tenderness: There is no abdominal tenderness.  Musculoskeletal:     Cervical back: Neck supple.  Skin:    General: Skin is warm and dry.  Neurological:     Mental Status: He is alert.    ED Results / Procedures / Treatments   Labs (all labs ordered are listed, but only abnormal results are displayed) Labs Reviewed - No data to display  EKG None  Radiology No results found.  Procedures Procedures   Medications Ordered in ED Medications  ketorolac (TORADOL) 15 MG/ML injection 15 mg (15 mg Intramuscular Given 05/07/21 0725)  penicillin v potassium (VEETID) tablet 500 mg (500 mg Oral Given  05/07/21 7672)    ED Course  I have reviewed the triage vital signs and the nursing notes.  Pertinent labs & imaging results that were available during my care of the patient were reviewed by me and considered in my medical decision making (see chart for details).    MDM Rules/Calculators/A&P                           Patient seen in the emergency department for evaluation of dental pain.  Physical exam reveals dental caries and significant decay worse at tooth #19 with surrounding gingival erythema but no appreciable periapical abscess.  No significant facial swelling or lymphadenopathy.  No trismus.  Patient was pain controlled with Toradol in the emergency department and given a single dose of penicillin VK.  This prescription will be continued and the patient will follow up with an emergency dental clinic today for likely tooth extraction.  Patient then discharged. Final Clinical Impression(s) / ED Diagnoses Final diagnoses:  None    Rx / DC Orders ED Discharge Orders     None        Arrietty Dercole, Wyn Forster, MD 05/07/21 [phone]

## 2021-05-07 NOTE — ED Triage Notes (Signed)
Pt to ED with c/o left sided dental pain with suspected infection with bloody gums when brushing. Pt states he is unable to fully close his mouth because the pressure hurts too much.

## 2021-06-24 ENCOUNTER — Other Ambulatory Visit: Payer: Self-pay

## 2021-06-24 ENCOUNTER — Emergency Department (HOSPITAL_BASED_OUTPATIENT_CLINIC_OR_DEPARTMENT_OTHER)
Admission: EM | Admit: 2021-06-24 | Discharge: 2021-06-24 | Disposition: A | Discharge status: Home / Self Care | Payer: Self-pay | Attending: Emergency Medicine | Admitting: Emergency Medicine

## 2021-06-24 ENCOUNTER — Encounter (HOSPITAL_BASED_OUTPATIENT_CLINIC_OR_DEPARTMENT_OTHER): Payer: Self-pay | Admitting: Emergency Medicine

## 2021-06-24 DIAGNOSIS — F17210 Nicotine dependence, cigarettes, uncomplicated: Secondary | ICD-10-CM | POA: Insufficient documentation

## 2021-06-24 DIAGNOSIS — K0889 Other specified disorders of teeth and supporting structures: Secondary | ICD-10-CM | POA: Insufficient documentation

## 2021-06-24 DIAGNOSIS — J45909 Unspecified asthma, uncomplicated: Secondary | ICD-10-CM | POA: Insufficient documentation

## 2021-06-24 MED ORDER — LIDOCAINE VISCOUS HCL 2 % MT SOLN
15.0000 mL | Freq: Once | OROMUCOSAL | Status: AC
  Administered 2021-06-24: 15 mL via OROMUCOSAL
  Filled 2021-06-24: qty 15

## 2021-06-24 MED ORDER — KETOROLAC TROMETHAMINE 60 MG/2ML IM SOLN
60.0000 mg | Freq: Once | INTRAMUSCULAR | Status: AC
  Administered 2021-06-24: 60 mg via INTRAMUSCULAR
  Filled 2021-06-24: qty 2

## 2021-06-24 MED ORDER — AMOXICILLIN-POT CLAVULANATE 875-125 MG PO TABS: 1.0000 | ORAL_TABLET | Freq: Two times a day (BID) | ORAL | 0 refills | Status: DC

## 2021-06-24 MED ORDER — AMOXICILLIN-POT CLAVULANATE 875-125 MG PO TABS
1.0000 | ORAL_TABLET | Freq: Once | ORAL | Status: AC
  Administered 2021-06-24: 1 via ORAL
  Filled 2021-06-24: qty 1

## 2021-06-24 MED ORDER — ACETAMINOPHEN-CODEINE #3 300-30 MG PO TABS: 1.0000 | ORAL_TABLET | Freq: Four times a day (QID) | ORAL | 0 refills | Status: DC | PRN

## 2021-06-24 MED ORDER — HYDROCODONE-ACETAMINOPHEN 5-325 MG PO TABS
1.0000 | ORAL_TABLET | Freq: Once | ORAL | Status: AC
Start: 2021-06-24 — End: 2021-06-24
  Administered 2021-06-24: 1 via ORAL
  Filled 2021-06-24: qty 1

## 2021-06-24 NOTE — ED Provider Notes (Signed)
[facility] EMERGENCY DEPT Provider Note   CSN: [id number] Arrival date & time: 06/24/21  1945     History Chief Complaint  Patient presents with   Dental Pain   Oral Swelling    Curtis Holcomb. is a 35 y.o. male.  This is a 35 y.o. male without significant medical history who presents to the ED with complaint of dental pain.  Patient with onset of dental pain approximately 2 to 3 days ago.  Pain to the left lower portion of his mouth.  Pain is been gradually worsening over the past 2 to 3 days.  No fevers, chills, dyspnea, nausea or vomiting.  No trismus.  No recent dental procedures although patient has poor dentition and broken tooth on the proximal area of the discomfort.  No tongue or lip swelling.  He is tolerating oral intake without difficulty.  Speaking normally.  No dyspnea.  No chest pain.  No abdominal pain, nausea or vomiting.    Recently acquired health insurance and will be pursuing a dentist first thing next week  The history is provided by the patient. No language interpreter was used.  Dental Pain Location:  Lower Quality:  Aching and throbbing Severity:  Mild Onset quality:  Gradual Progression:  Worsening Chronicity:  New Associated symptoms: facial swelling   Associated symptoms: no fever and no headaches       Past Medical History:  Diagnosis Date   Asthma    Folliculitis     There are no problems to display for this patient.   History reviewed. No pertinent surgical history.     Family History  Family history unknown: Yes    Social History   Tobacco Use   Smoking status: Every Day    Packs/day: 0.10    Types: Cigarettes   Smokeless tobacco: Never  Vaping Use   Vaping Use: Never used  Substance Use Topics   Alcohol use: Yes    Comment: occ   Drug use: Not Currently    Types: Marijuana    Home Medications Prior to Admission medications   Medication Sig Start Date End Date Taking? Authorizing Provider   acetaminophen-codeine (TYLENOL #3) 300-30 MG tablet Take 1 tablet by mouth every 6 (six) hours as needed for moderate pain. 06/24/21  Yes Tanda Rockers A, DO  amoxicillin-clavulanate (AUGMENTIN) 875-125 MG tablet Take 1 tablet by mouth every 12 (twelve) hours. 06/24/21  Yes Sloan Leiter, DO  HYDROcodone-acetaminophen (NORCO) 5-325 MG tablet Take 1 tablet by mouth every 6 (six) hours as needed for severe pain. 02/03/21   Molpus, Jonny Ruiz, MD    Allergies    Patient has no known allergies.  Review of Systems   Review of Systems  Constitutional:  Negative for chills and fever.  HENT:  Positive for dental problem and facial swelling. Negative for trouble swallowing.   Eyes:  Negative for photophobia and visual disturbance.  Respiratory:  Negative for cough and shortness of breath.   Cardiovascular:  Negative for chest pain and palpitations.  Gastrointestinal:  Negative for abdominal pain, nausea and vomiting.  Endocrine: Negative for polydipsia and polyuria.  Genitourinary:  Negative for difficulty urinating and hematuria.  Musculoskeletal:  Negative for gait problem and joint swelling.  Skin:  Negative for pallor and rash.  Neurological:  Negative for syncope and headaches.  Psychiatric/Behavioral:  Negative for agitation and confusion.    Physical Exam Updated Vital Signs BP 126/90 (BP Location: Right Arm)   Pulse 78   Temp  97.7 F (36.5 C)   Resp 18   Ht 5\' 8"  (1.727 m)   Wt 99.8 kg   SpO2 98%   BMI 33.45 kg/m   Physical Exam Vitals and nursing note reviewed.  Constitutional:      General: He is not in acute distress.    Appearance: He is well-developed.  HENT:     Head: Normocephalic and atraumatic.      Comments: No trismus, no stridor, no drooling, no evidence of ludwig's angina, no angioedema     Right Ear: External ear normal.     Left Ear: External ear normal.     Mouth/Throat:     Mouth: Mucous membranes are moist.     Dentition: Abnormal dentition. Dental  tenderness present.     Pharynx: Oropharynx is clear. Uvula midline. No oropharyngeal exudate or uvula swelling.   Eyes:     General: No scleral icterus. Cardiovascular:     Rate and Rhythm: Normal rate and regular rhythm.     Pulses: Normal pulses.     Heart sounds: Normal heart sounds.  Pulmonary:     Effort: Pulmonary effort is normal. No respiratory distress.     Breath sounds: Normal breath sounds.  Abdominal:     General: Abdomen is flat.     Palpations: Abdomen is soft.     Tenderness: There is no abdominal tenderness.  Musculoskeletal:        General: Normal range of motion.     Cervical back: Normal range of motion.     Right lower leg: No edema.     Left lower leg: No edema.  Skin:    General: Skin is warm and dry.     Capillary Refill: Capillary refill takes less than 2 seconds.  Neurological:     Mental Status: He is alert and oriented to person, place, and time.  Psychiatric:        Mood and Affect: Mood normal.        Behavior: Behavior normal.    ED Results / Procedures / Treatments   Labs (all labs ordered are listed, but only abnormal results are displayed) Labs Reviewed - No data to display  EKG None  Radiology No results found.  Procedures Procedures   Medications Ordered in ED Medications  ketorolac (TORADOL) injection 60 mg (has no administration in time range)  HYDROcodone-acetaminophen (NORCO/VICODIN) 5-325 MG per tablet 1 tablet (has no administration in time range)  lidocaine (XYLOCAINE) 2 % viscous mouth solution 15 mL (has no administration in time range)  amoxicillin-clavulanate (AUGMENTIN) 875-125 MG per tablet 1 tablet (has no administration in time range)  lidocaine (XYLOCAINE) 2 % viscous mouth solution 15 mL (has no administration in time range)    ED Course  I have reviewed the triage vital signs and the nursing notes.  Pertinent labs & imaging results that were available during my care of the patient were reviewed by me and  considered in my medical decision making (see chart for details).    MDM Rules/Calculators/A&P                           CC: dental pain  This patient complains of above; this involves an extensive number of treatment options and is a complaint that carries with it a high risk of complications and morbidity. Vital signs were reviewed. Serious etiologies considered.  Record review:  Previous records obtained and reviewed    Patient presenting  for the above primary dental complaint. Likely dental infection. Possible small abscess. Pt was started on Abx. Given pain meds for home, will alternate between Motrin and Tylenol, t3 for breakthrough pain, tid Listerine rinses, Orajel prn, brushing, and given list of outpatient dental resources. Advised to f/u with dentist in the next 48 hours.    Patient in no distress and overall condition improved here in the ED. Detailed discussions were had with the patient regarding current findings, and need for close f/u with PCP or on call doctor. The patient has been instructed to return immediately if the symptoms worsen in any way for re-evaluation. Patient verbalized understanding and is in agreement with current care plan. All questions answered prior to discharge.    This chart was dictated using voice recognition software.  Despite best efforts to proofread,  errors can occur which can change the documentation meaning.  Final Clinical Impression(s) / ED Diagnoses Final diagnoses:  Pain, dental    Rx / DC Orders ED Discharge Orders          Ordered    acetaminophen-codeine (TYLENOL #3) 300-30 MG tablet  Every 6 hours PRN        06/24/21 2103    amoxicillin-clavulanate (AUGMENTIN) 875-125 MG tablet  Every 12 hours        06/24/21 2103             Jeanell Sparrow, DO 06/24/21 2110

## 2021-06-24 NOTE — ED Triage Notes (Signed)
Pt presents to ED POV. Pt c/o tooth aches and facial swelling. Pt reports that pain began on Friday.

## 2021-06-24 NOTE — Discharge Instructions (Addendum)
Please use listerine mouth wash 2-3x daily as tolerated for the next week.   Please see dentist within the next 48 hours.   [facility] Address: [address] [place], [place] [address] Phone: [phone] [email or web address]

## 2021-11-02 ENCOUNTER — Other Ambulatory Visit: Payer: Self-pay

## 2021-11-02 ENCOUNTER — Encounter (HOSPITAL_BASED_OUTPATIENT_CLINIC_OR_DEPARTMENT_OTHER): Payer: Self-pay

## 2021-11-02 DIAGNOSIS — K0889 Other specified disorders of teeth and supporting structures: Secondary | ICD-10-CM | POA: Insufficient documentation

## 2021-11-02 NOTE — ED Triage Notes (Signed)
Toothache with broken tooth on lower left mouth. ?

## 2021-11-03 ENCOUNTER — Emergency Department (HOSPITAL_BASED_OUTPATIENT_CLINIC_OR_DEPARTMENT_OTHER)
Admission: EM | Admit: 2021-11-03 | Discharge: 2021-11-03 | Disposition: A | Discharge status: Home / Self Care | Payer: BC Managed Care – PPO | Attending: Emergency Medicine | Admitting: Emergency Medicine

## 2021-11-03 DIAGNOSIS — K0889 Other specified disorders of teeth and supporting structures: Secondary | ICD-10-CM

## 2021-11-03 MED ORDER — PENICILLIN V POTASSIUM 250 MG PO TABS
500.0000 mg | ORAL_TABLET | Freq: Once | ORAL | Status: AC
  Administered 2021-11-03: 500 mg via ORAL
  Filled 2021-11-03: qty 2

## 2021-11-03 MED ORDER — PENICILLIN V POTASSIUM 500 MG PO TABS: 500.0000 mg | ORAL_TABLET | Freq: Three times a day (TID) | ORAL | 0 refills | Status: AC

## 2021-11-03 MED ORDER — HYDROCODONE-ACETAMINOPHEN 5-325 MG PO TABS
1.0000 | ORAL_TABLET | Freq: Once | ORAL | Status: AC
  Administered 2021-11-03: 1 via ORAL
  Filled 2021-11-03: qty 1

## 2021-11-03 NOTE — ED Notes (Signed)
EDP at bedside  

## 2021-11-03 NOTE — ED Provider Notes (Signed)
?  [facility]PT ?Provider Note ? ? ?CSN: [id number] ?Arrival date & time: 11/02/21  2149 ? ?  ? ?History ? ?Chief Complaint  ?Patient presents with  ? Dental Pain  ? ? ?Curtis Miranda. is a 36 y.o. male. ? ?The history is provided by the patient.  ?Dental Pain ?Location:  Lower ?Quality:  Aching ?Severity:  Moderate ?Onset quality:  Gradual ?Timing:  Constant ?Progression:  Worsening ?Chronicity:  Recurrent ?Associated symptoms: no fever   ? ?  ? ?Home Medications ?Prior to Admission medications   ?Medication Sig Start Date End Date Taking? Authorizing Provider  ?penicillin v potassium (VEETID) 500 MG tablet Take 1 tablet (500 mg total) by mouth 3 (three) times daily for 7 days. 11/03/21 11/10/21 Yes Zadie Rhine, MD  ?   ? ?Allergies    ?Patient has no known allergies.   ? ?Review of Systems   ?Review of Systems  ?Constitutional:  Negative for fever.  ?HENT:  Positive for dental problem.   ? ?Physical Exam ?Updated Vital Signs ?BP 121/74   Pulse (!) 50   Temp 98 ?F (36.7 ?C) (Oral)   Resp 18   Ht 1.727 m (5\' 8" )   Wt 90.7 kg   SpO2 97%   BMI 30.41 kg/m?  ?Physical Exam ?CONSTITUTIONAL: Well developed/well nourished ?HEAD AND FACE: Normocephalic/atraumatic ?EYES: EOMI/PERRL ?ENMT: Mucous membranes moist.   No trismus.  No focal abscess noted.  Tenderness to left lower molar.  No gingival abscess.  No facial swelling ?NECK: supple no meningeal signs ?ABDOMEN: soft, nontender, no rebound or guarding ?NEURO: Pt is awake/alert, moves all extremitiesx4 ?EXTREMITIES:full ROM ?SKIN: warm, color normal ? ?ED Results / Procedures / Treatments   ?Labs ?(all labs ordered are listed, but only abnormal results are displayed) ?Labs Reviewed - No data to display ? ?EKG ?None ? ?Radiology ?No results found. ? ?Procedures ?Procedures  ? ? ?Medications Ordered in ED ?Medications  ?penicillin v potassium (VEETID) tablet 500 mg (500 mg Oral Given 11/03/21 0207)  ?HYDROcodone-acetaminophen  (NORCO/VICODIN) 5-325 MG per tablet 1 tablet (1 tablet Oral Given 11/03/21 0207)  ? ? ?ED Course/ Medical Decision Making/ A&P ?  ?                        ?Medical Decision Making ?Risk ?Prescription drug management. ? ? ?Patient reports he plans to call a dentist in 48 hours.  We will start on oral antibiotics as he has had good response to penicillin before. ? ? ? ? ? ? ? ?Final Clinical Impression(s) / ED Diagnoses ?Final diagnoses:  ?Pain, dental  ? ? ?Rx / DC Orders ?ED Discharge Orders   ? ?      Ordered  ?  penicillin v potassium (VEETID) 500 MG tablet  3 times daily       ? 11/03/21 0202  ? ?  ?  ? ?  ? ? ?  ?11/05/21, MD ?11/03/21 11/05/21 ? ?

## 2023-01-05 IMAGING — CR DG CHEST 2V
2 series · 2 of 2 positions shown · non-contrast
Comparison: None.

CLINICAL DATA: Chest pain and shortness of breath.

EXAM:
CHEST - 2 VIEW

[w chest pa]
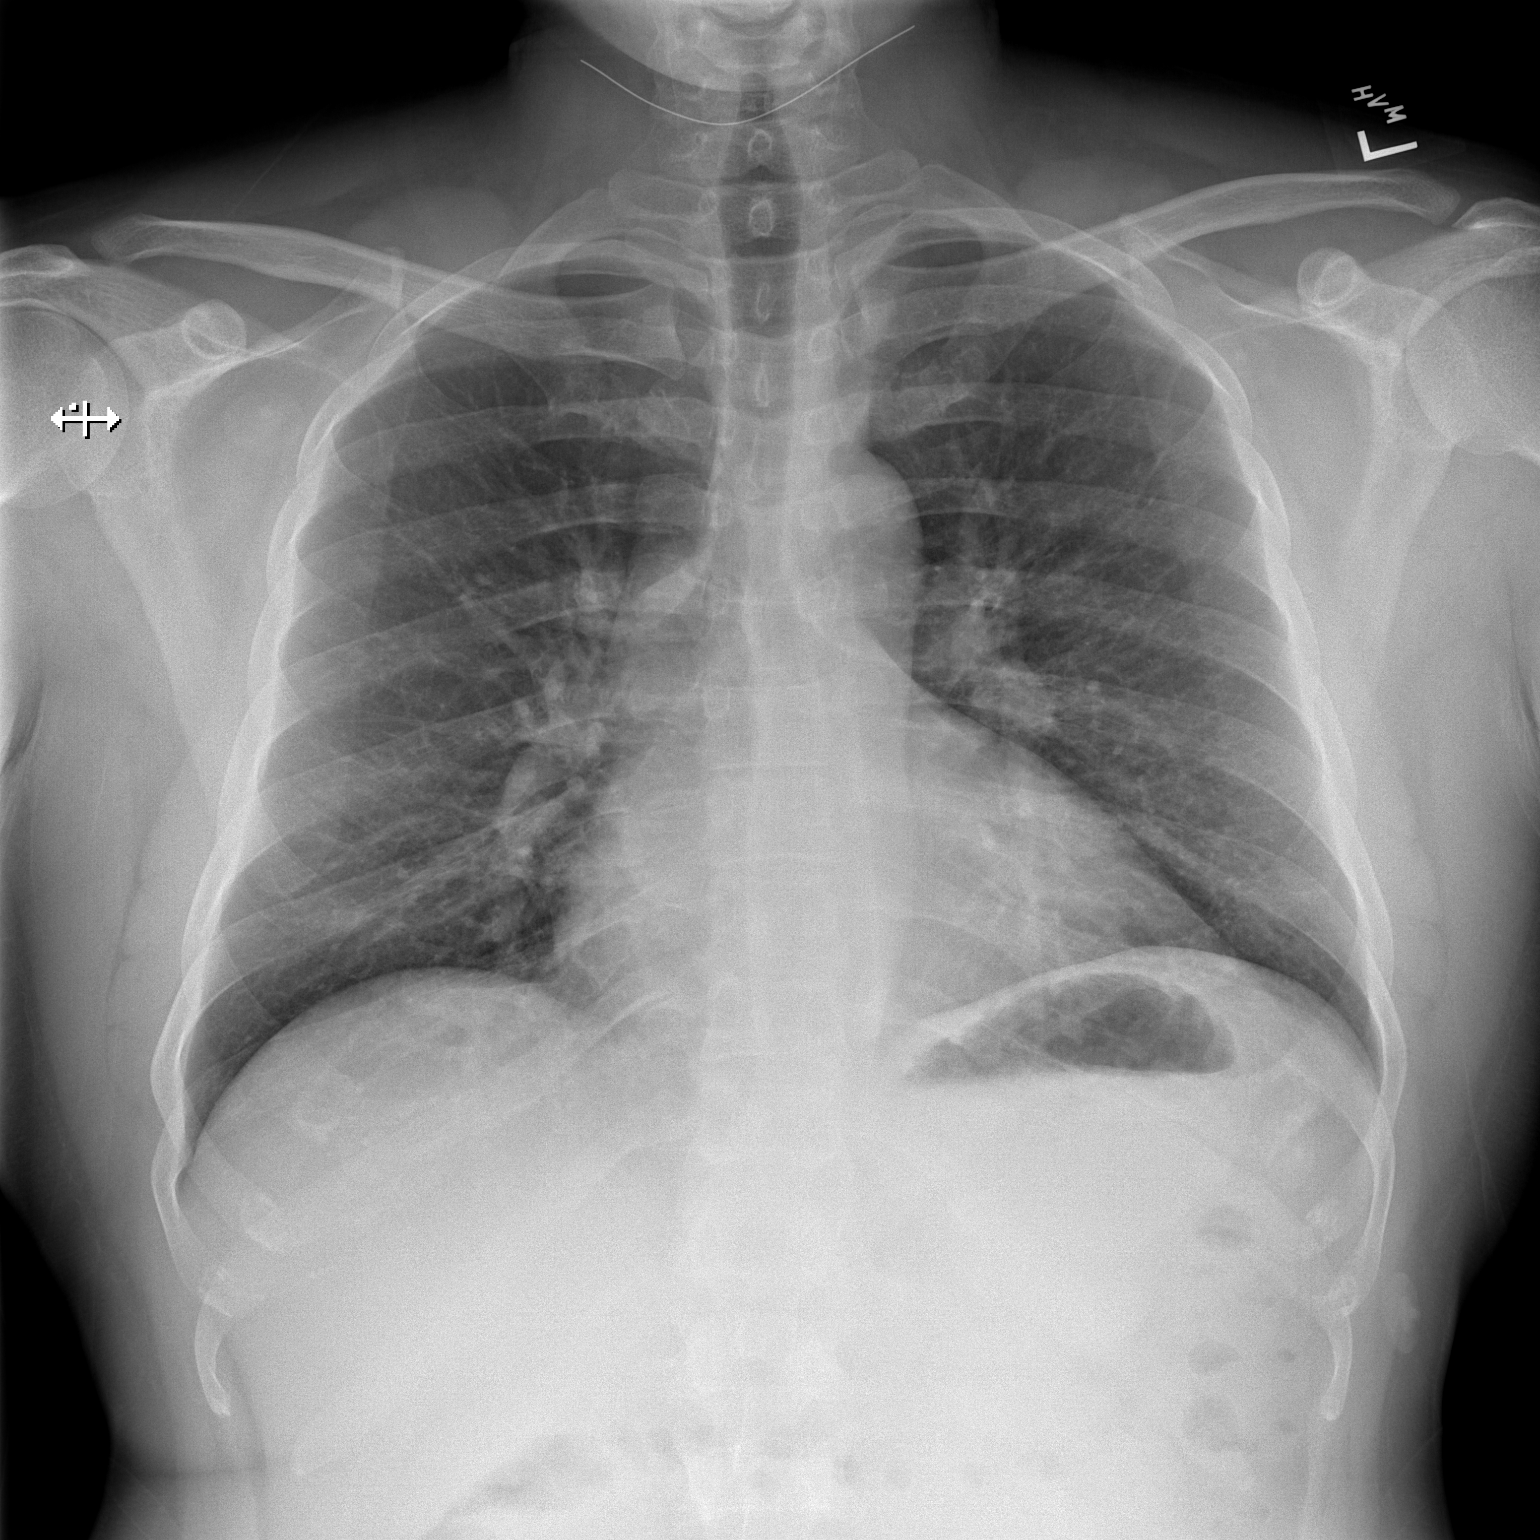

[w chest lat]
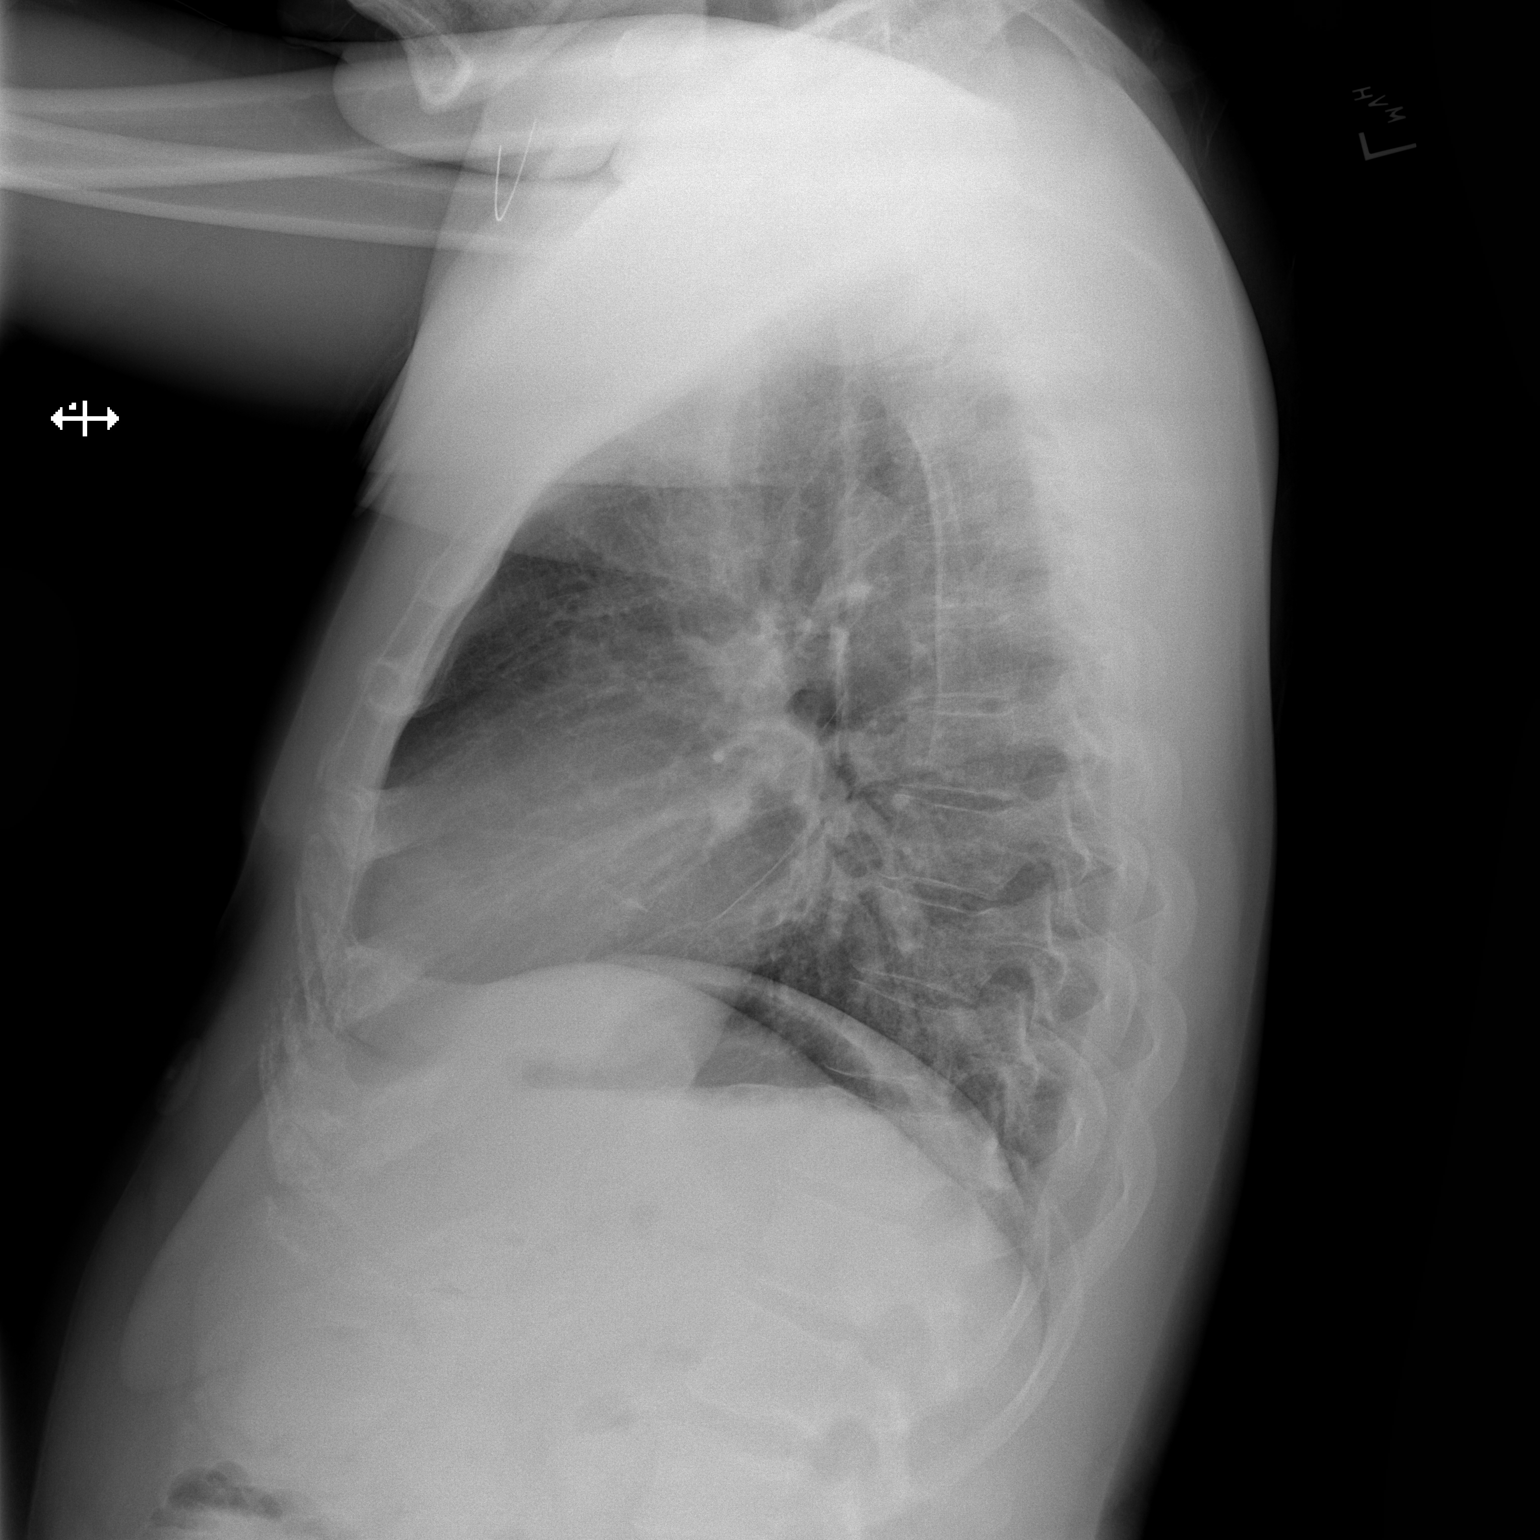

[2 of 2 positions shown; findings below may reference images not displayed]

FINDINGS: The cardiomediastinal contours are normal. Mild lower lobe
predominant peribronchial thickening. Pulmonary vasculature is
normal. No consolidation, pleural effusion, or pneumothorax. No
acute osseous abnormalities are seen.
IMPRESSION: Mild lower lobe predominant peribronchial thickening suggesting
bronchitis or asthma.

## 2023-06-29 ENCOUNTER — Other Ambulatory Visit: Payer: Self-pay

## 2023-06-29 DIAGNOSIS — X58XXXA Exposure to other specified factors, initial encounter: Secondary | ICD-10-CM | POA: Diagnosis not present

## 2023-06-29 DIAGNOSIS — S39012A Strain of muscle, fascia and tendon of lower back, initial encounter: Secondary | ICD-10-CM | POA: Diagnosis not present

## 2023-06-29 DIAGNOSIS — M549 Dorsalgia, unspecified: Secondary | ICD-10-CM | POA: Diagnosis present

## 2023-06-29 NOTE — ED Triage Notes (Signed)
Pt POV from home reporting lower back pain after bending over to pick up shoes yesterday. Unable to work today due to pain.

## 2023-06-30 ENCOUNTER — Emergency Department (HOSPITAL_BASED_OUTPATIENT_CLINIC_OR_DEPARTMENT_OTHER)
Admission: EM | Admit: 2023-06-30 | Discharge: 2023-06-30 | Disposition: A | Discharge status: Home / Self Care | Payer: BC Managed Care – PPO | Attending: Emergency Medicine | Admitting: Emergency Medicine

## 2023-06-30 ENCOUNTER — Emergency Department (HOSPITAL_BASED_OUTPATIENT_CLINIC_OR_DEPARTMENT_OTHER): Payer: BC Managed Care – PPO | Admitting: Radiology

## 2023-06-30 DIAGNOSIS — S39012A Strain of muscle, fascia and tendon of lower back, initial encounter: Secondary | ICD-10-CM

## 2023-06-30 MED ORDER — CYCLOBENZAPRINE HCL 10 MG PO TABS: 10.0000 mg | ORAL_TABLET | Freq: Three times a day (TID) | ORAL | 0 refills | Status: AC | PRN

## 2023-06-30 MED ORDER — NAPROXEN 500 MG PO TABS: 500.0000 mg | ORAL_TABLET | Freq: Two times a day (BID) | ORAL | 0 refills | Status: AC

## 2023-06-30 MED ORDER — IBUPROFEN 800 MG PO TABS
800.0000 mg | ORAL_TABLET | Freq: Once | ORAL | Status: AC
  Administered 2023-06-30: 800 mg via ORAL
  Filled 2023-06-30: qty 1

## 2023-06-30 MED ORDER — CYCLOBENZAPRINE HCL 10 MG PO TABS
10.0000 mg | ORAL_TABLET | Freq: Once | ORAL | Status: AC
  Administered 2023-06-30: 10 mg via ORAL
  Filled 2023-06-30: qty 1

## 2023-06-30 NOTE — Discharge Instructions (Signed)
Begin taking naproxen as prescribed.  Begin taking Flexeril as prescribed as needed for pain not relieved with naproxen.  Follow-up with primary doctor if not improving in the next week.

## 2023-06-30 NOTE — ED Provider Notes (Signed)
[place] EMERGENCY DEPARTMENT AT [facility] Provider Note   CSN: [id number] Arrival date & time: 06/29/23  2314     History  Chief Complaint  Patient presents with   Back Pain    Curtis Miranda. is a 37 y.o. male.  Patient is a 37 year old male with no significant past medical history presenting with complaints of back pain.  2 days ago he bent over to put on his shoes and felt a pull in his back.  Since then he has been having discomfort when he moves, bends, or changes position.  He denies radiation into his legs.  He denies any bowel or bladder complaints.  No weakness or numbness.  He has not taken anything at home for the pain.  The history is provided by the patient.       Home Medications Prior to Admission medications   Not on File      Allergies    Patient has no known allergies.    Review of Systems   Review of Systems  All other systems reviewed and are negative.   Physical Exam Updated Vital Signs BP 116/74   Pulse 81   Temp 98.1 F (36.7 C) (Temporal)   Resp 16   Ht 5\' 8"  (1.727 m)   SpO2 95%   BMI 30.41 kg/m  Physical Exam Vitals and nursing note reviewed.  Constitutional:      Appearance: Normal appearance.  HENT:     Head: Normocephalic and atraumatic.  Pulmonary:     Effort: Pulmonary effort is normal.  Musculoskeletal:     Comments: There is tenderness to palpation in the soft tissues of the lower lumbar region.  Skin:    General: Skin is warm and dry.  Neurological:     Mental Status: He is alert and oriented to person, place, and time.     Comments: Strength is 5 out of 5 in both lower extremities.  He is able to ambulate on heels and toes without difficulty.  DTRs are 1+ and symmetrical in the patellar and Achilles tendons bilaterally.     ED Results / Procedures / Treatments   Labs (all labs ordered are listed, but only abnormal results are displayed) Labs Reviewed - No data to  display  EKG None  Radiology DG Lumbar Spine Complete Result Date: 06/30/2023 CLINICAL DATA:  Lower back pain after bending over to pick up shoes yesterday. Unable to work today due to pain. EXAM: LUMBAR SPINE - COMPLETE 4+ VIEW COMPARISON:  None Available. FINDINGS: No evidence of acute fracture or traumatic listhesis. Intervertebral disc space height is maintained. IMPRESSION: No evidence of acute fracture or traumatic listhesis. Electronically Signed   By: Minerva Fester M.D.   On: 06/30/2023 00:21    Procedures Procedures    Medications Ordered in ED Medications  ibuprofen (ADVIL) tablet 800 mg (has no administration in time range)  cyclobenzaprine (FLEXERIL) tablet 10 mg (has no administration in time range)    ED Course/ Medical Decision Making/ A&P  Patient presenting with low back pain as described in the HPI.  He arrives here with stable vital signs and is afebrile.  His physical examination is unremarkable and reassuring.    X-rays of the lumbar spine obtained in triage showed no acute abnormality.  Patient's symptoms are most likely related to a pulled muscle or possibly a herniated disc.  Either way, he is neurologically intact with no red flags that would suggest an emergent situation.  I  believe as though he can be treated with NSAIDs, muscle relaxers, and follow-up as needed.  Final Clinical Impression(s) / ED Diagnoses Final diagnoses:  None    Rx / DC Orders ED Discharge Orders     None         Geoffery Lyons, MD 06/30/23 [phone]
